# Patient Record
Sex: Female | Born: 1975 | Race: White | Hispanic: No | Marital: Married | Smoking: Never smoker
Health system: Southern US, Community
[De-identification: ages and names within clinical notes are randomized; demographics above are authoritative.]

## PROBLEM LIST (undated history)

## (undated) DIAGNOSIS — C73 Malignant neoplasm of thyroid gland: Secondary | ICD-10-CM

## (undated) DIAGNOSIS — Z923 Personal history of irradiation: Secondary | ICD-10-CM

## (undated) DIAGNOSIS — T4145XA Adverse effect of unspecified anesthetic, initial encounter: Secondary | ICD-10-CM

## (undated) DIAGNOSIS — Z8679 Personal history of other diseases of the circulatory system: Secondary | ICD-10-CM

## (undated) DIAGNOSIS — Z8489 Family history of other specified conditions: Secondary | ICD-10-CM

## (undated) DIAGNOSIS — T8859XA Other complications of anesthesia, initial encounter: Secondary | ICD-10-CM

## (undated) HISTORY — PX: OTHER SURGICAL HISTORY: SHX169

## (undated) HISTORY — PX: INGUINAL HERNIA REPAIR: SUR1180

## (undated) HISTORY — DX: Personal history of irradiation: Z92.3

## (undated) HISTORY — PX: BIOPSY THYROID: PRO38

## (undated) HISTORY — DX: Malignant neoplasm of thyroid gland: C73

---

## 1998-10-19 ENCOUNTER — Other Ambulatory Visit: Admission: RE | Admit: 1998-10-19 | Discharge: 1998-10-19 | Payer: Self-pay | Admitting: Gynecology

## 2001-10-09 ENCOUNTER — Other Ambulatory Visit: Admission: RE | Admit: 2001-10-09 | Discharge: 2001-10-09 | Payer: Self-pay | Admitting: Gynecology

## 2002-12-30 ENCOUNTER — Other Ambulatory Visit: Admission: RE | Admit: 2002-12-30 | Discharge: 2002-12-30 | Payer: Self-pay | Admitting: Gynecology

## 2003-04-22 ENCOUNTER — Emergency Department (HOSPITAL_COMMUNITY): Admission: EM | Admit: 2003-04-22 | Discharge: 2003-04-22 | Payer: Self-pay | Admitting: *Deleted

## 2003-04-22 ENCOUNTER — Encounter: Payer: Self-pay | Admitting: *Deleted

## 2004-06-29 ENCOUNTER — Other Ambulatory Visit: Admission: RE | Admit: 2004-06-29 | Discharge: 2004-06-29 | Payer: Self-pay | Admitting: Gynecology

## 2004-09-28 ENCOUNTER — Ambulatory Visit (HOSPITAL_COMMUNITY): Admission: RE | Admit: 2004-09-28 | Discharge: 2004-09-28 | Payer: Self-pay | Admitting: Obstetrics and Gynecology

## 2004-09-28 IMAGING — US US OB COMP +14 WK
1 series · 13 of 28 positions shown · non-contrast
Comparison: none

CLINICAL DATA: Anatomic exam.  No current problems.

[Series 1: us ob comp +14 wk · 0.33mm/px · 13 of 83 slices shown]
[im 4/83]
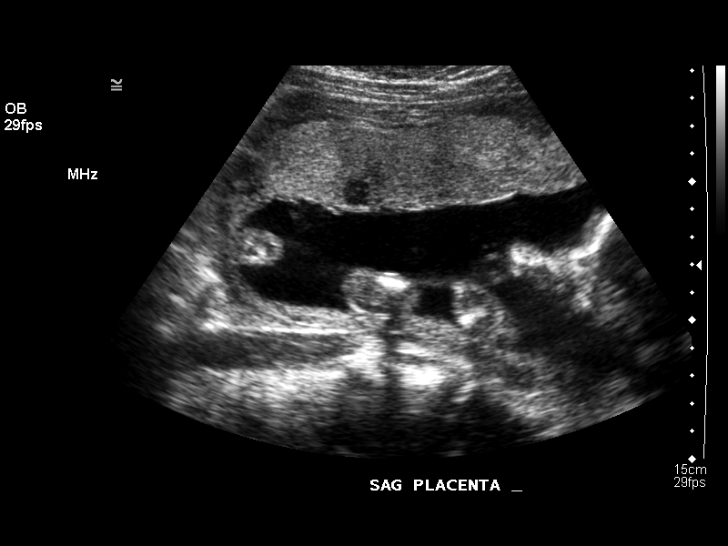
[im 10/83]
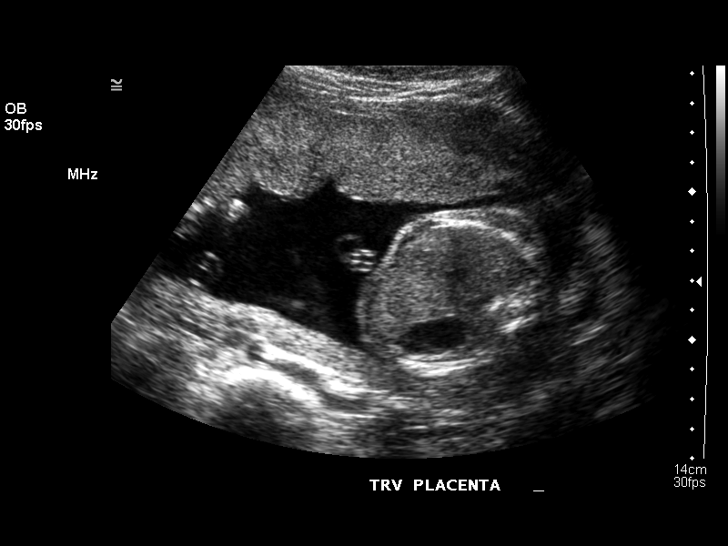
[im 16/83]
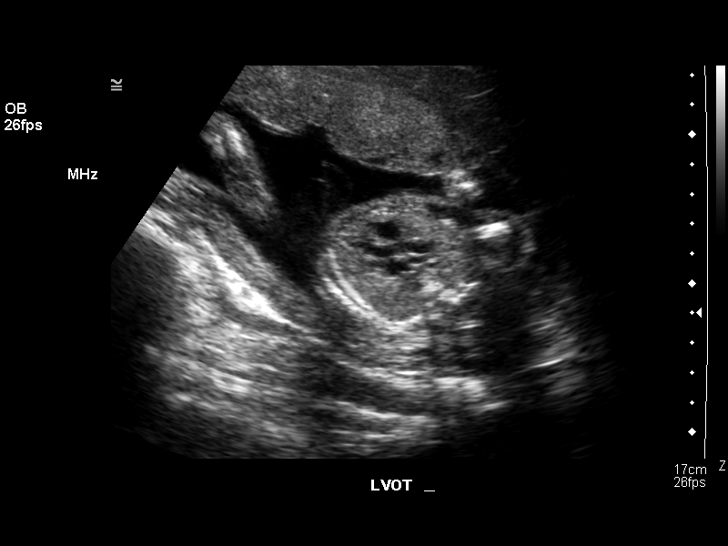
[im 22/83]
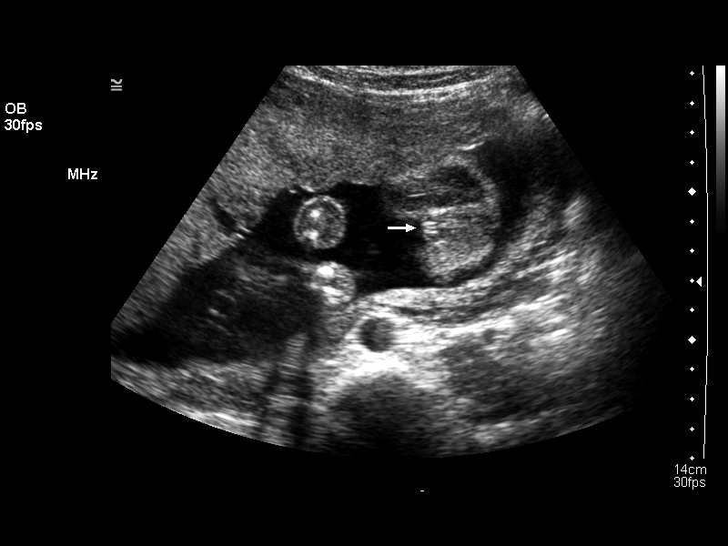
[im 28/83]
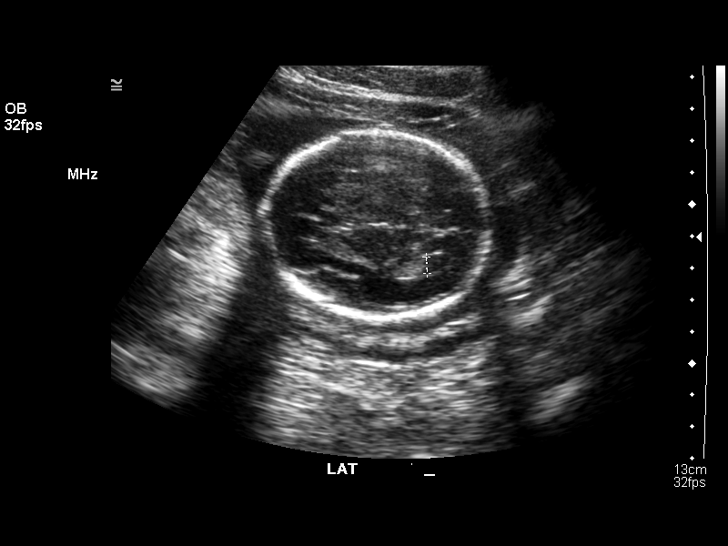
[im 34/83]
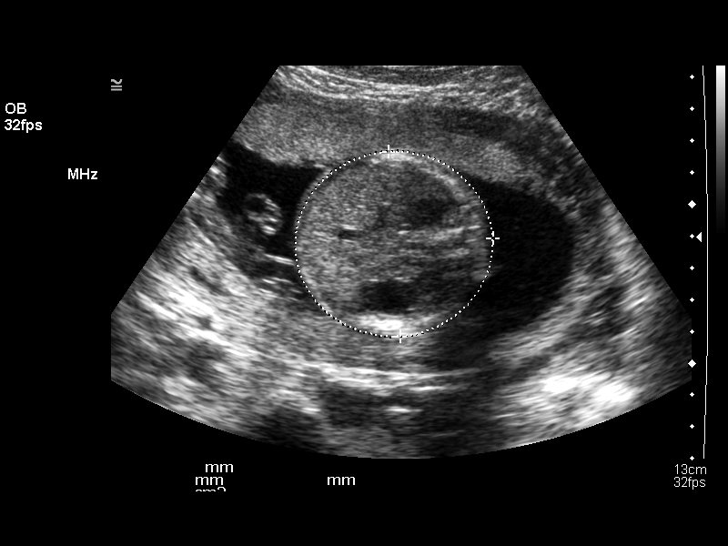
[im 43/83]
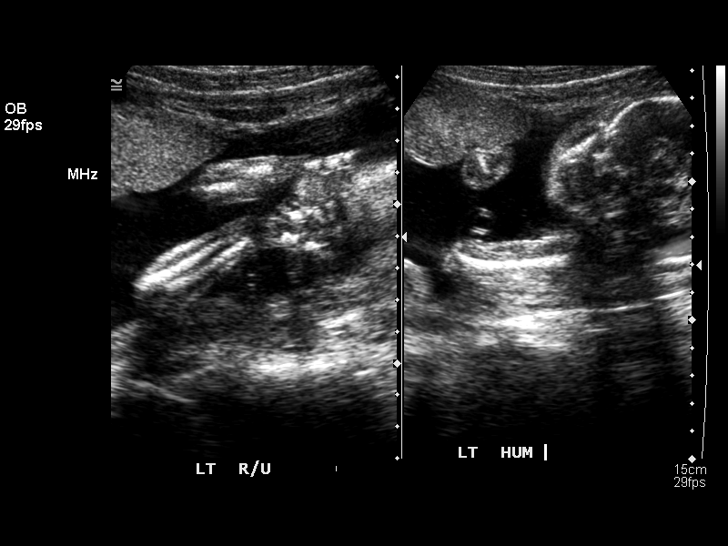
[im 49/83]
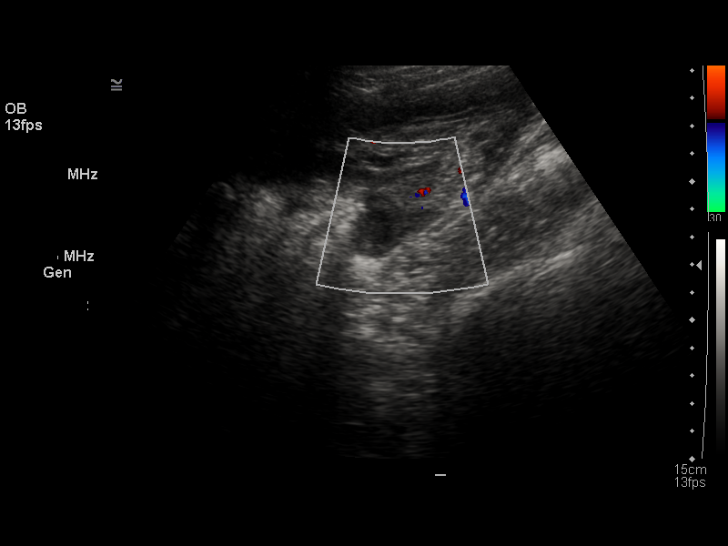
[im 55/83]
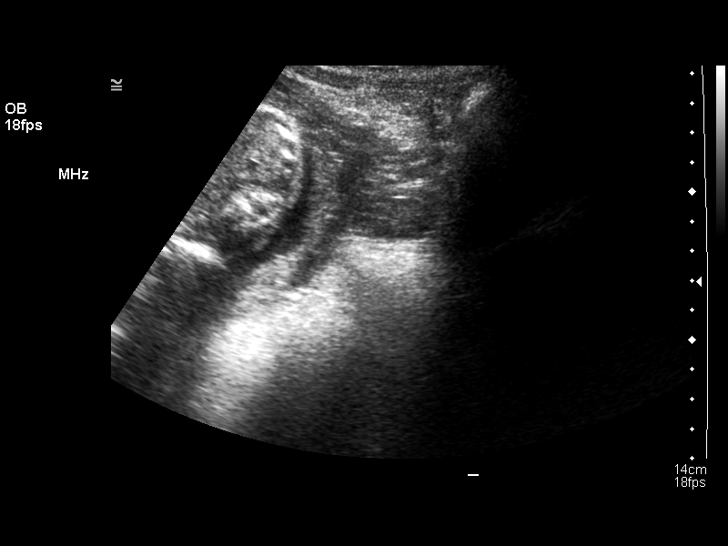
[im 61/83]
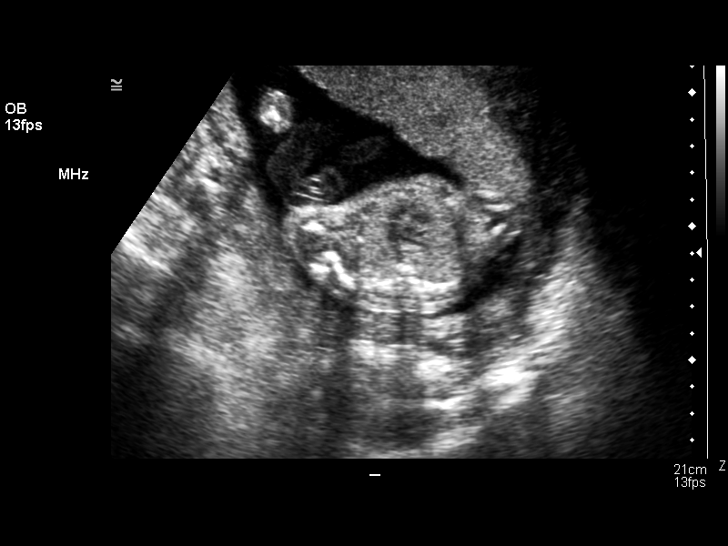
[im 67/83]
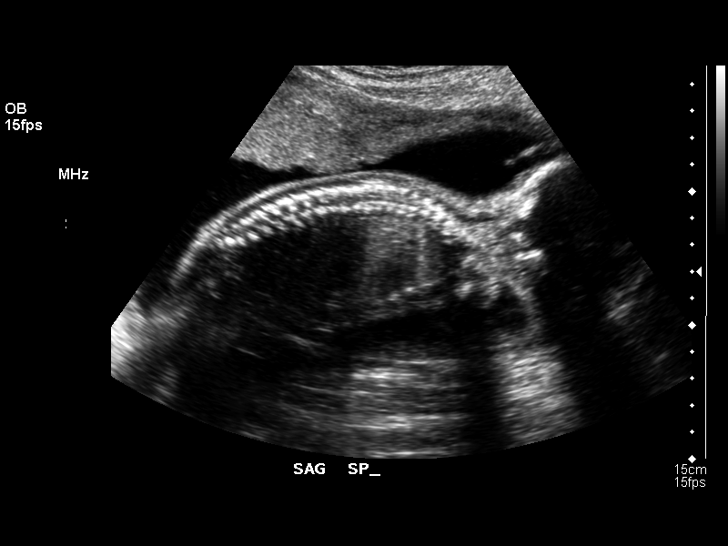
[im 73/83]
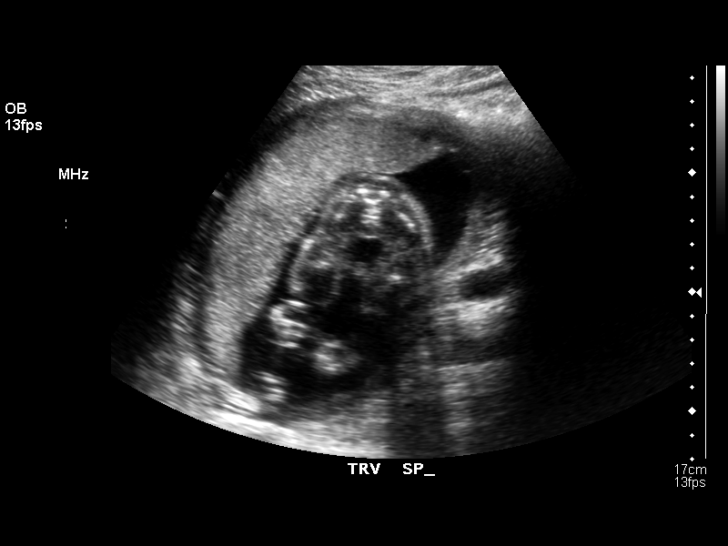
[im 79/83]
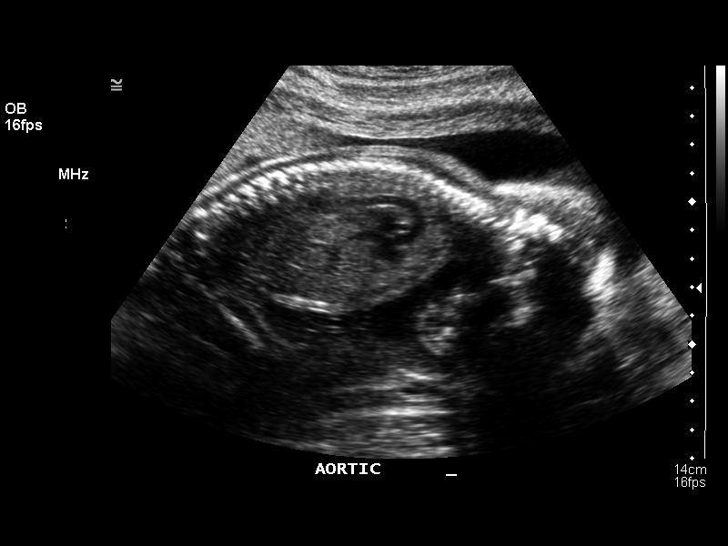

[13 of 28 positions shown; findings below may reference images not displayed]

OBSTETRICAL ULTRASOUND:
Number of Fetuses:  1
Heart Rate:  139
Movement:  Yes
Breathing:    No  
Presentation:  Cephalic
Placental Location:  Anterior
Grade:  I
Previa:  No
Amniotic Fluid (Subjective):  Normal
Amniotic Fluid (Objective):   3.9 cm Vertical pocket 

FETAL BIOMETRY
BPD:  5.8 cm   23 w 6 d
HC:  21.8 cm   23 w 6 d
AC:  19.0 cm   23 w 5 d
FL:    4.2 cm   23 w 4 d

MEAN GA:  23 w 5 d

FETAL ANATOMY
Lateral Ventricles:    Visualized 
Thalami/CSP:      Visualized 
Posterior Fossa:  Visualized 
Nuchal Region:    N/A
Spine:      Visualized 
4 Chamber Heart on Left:      Visualized 
Stomach on Left:      Visualized 
3 Vessel Cord:    Visualized 
Cord Insertion site:    Visualized 
Kidneys:  Visualized 
Bladder:  Visualized 
Extremities:      Visualized 

ADDITIONAL ANATOMY VISUALIZED:  LVOT, RVOT, upper lip, orbits, profile, diaphragm, heel, 5th digit, ductal arch, aortic arch, and female genitalia.  

MATERNAL UTERINE AND ADNEXAL FINDINGS
Cervix:   3.5 cm Transabdominally
IMPRESSION: 1.  Single intrauterine pregnancy demonstrating an estimated gestational age by ultrasound of 23 weeks and 5 days.  Correlation with assigned gestational age by LMP of 23 weeks and 0 days suggests appropriate growth.  
2.  No focal fetal or placental abnormalities are noted with a good anatomic exam possible.  
3.  Subjectively and quantitatively normal amniotic fluid volume and normal cervical length. 

</u12:p>

## 2005-01-28 ENCOUNTER — Inpatient Hospital Stay (HOSPITAL_COMMUNITY): Admission: AD | Admit: 2005-01-28 | Discharge: 2005-01-28 | Payer: Self-pay | Admitting: Gynecology

## 2005-01-29 ENCOUNTER — Inpatient Hospital Stay (HOSPITAL_COMMUNITY): Admission: AD | Admit: 2005-01-29 | Discharge: 2005-01-31 | Payer: Self-pay | Admitting: Obstetrics and Gynecology

## 2005-05-26 ENCOUNTER — Inpatient Hospital Stay (HOSPITAL_COMMUNITY): Admission: AD | Admit: 2005-05-26 | Discharge: 2005-05-26 | Payer: Self-pay | Admitting: Obstetrics and Gynecology

## 2005-09-13 ENCOUNTER — Other Ambulatory Visit: Admission: RE | Admit: 2005-09-13 | Discharge: 2005-09-13 | Payer: Self-pay | Admitting: Obstetrics and Gynecology

## 2006-04-08 ENCOUNTER — Encounter: Admission: RE | Admit: 2006-04-08 | Discharge: 2006-04-08 | Payer: Self-pay | Admitting: Gastroenterology

## 2006-04-08 IMAGING — CR DG BE W/ AIR HIGH DENSITY
8 of 9 series · 8 of 9 positions shown · non-contrast
Comparison: none

CLINICAL DATA: Constipation and abdominal pain. 
 AIR CONTRAST BARIUM ENEMA:
 The colon was adequately visualized in air contrast.  There were several scattered diverticula noted throughout the colon.  There were no persistent intrinsic or extrinsic filling defects and the mucosal pattern of the colon is normal.  The terminal ileum refluxed and had a normal appearance.

[view not recorded (1 of 8)]
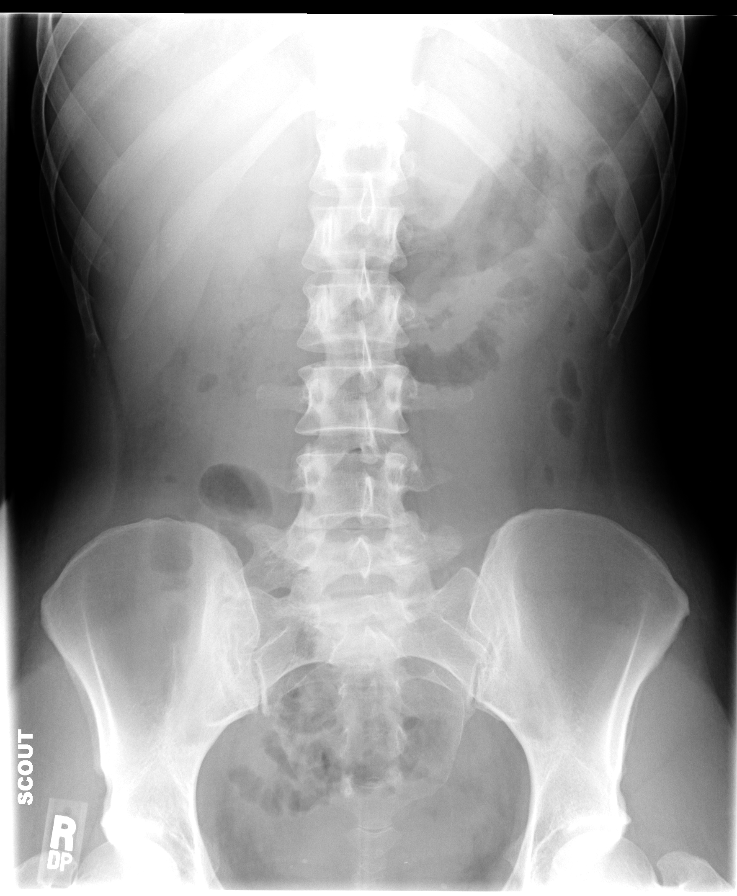

[view not recorded (2 of 8)]
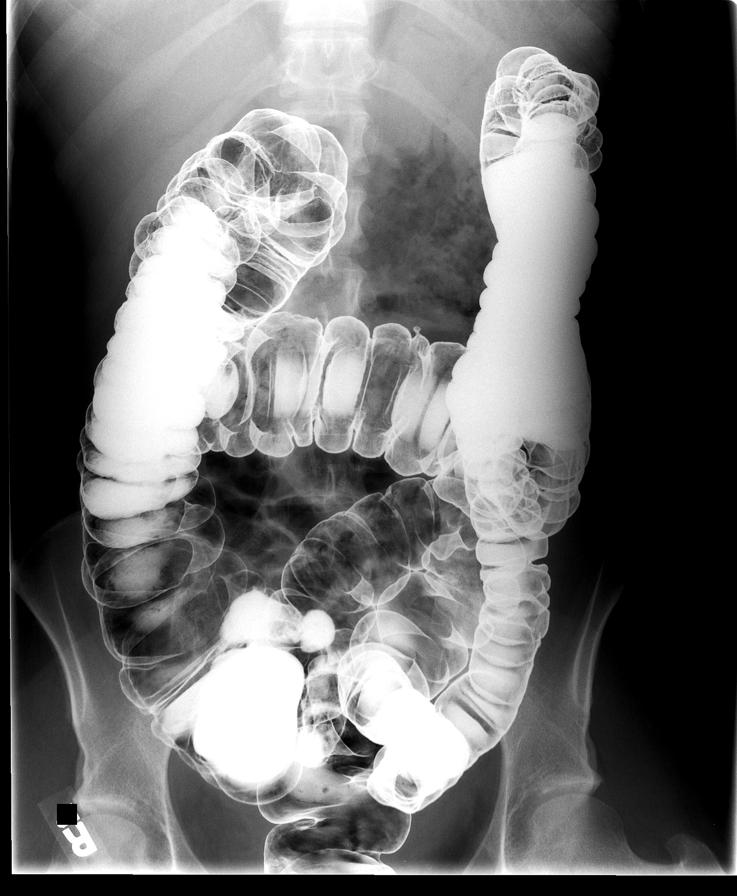

[view not recorded (3 of 8)]
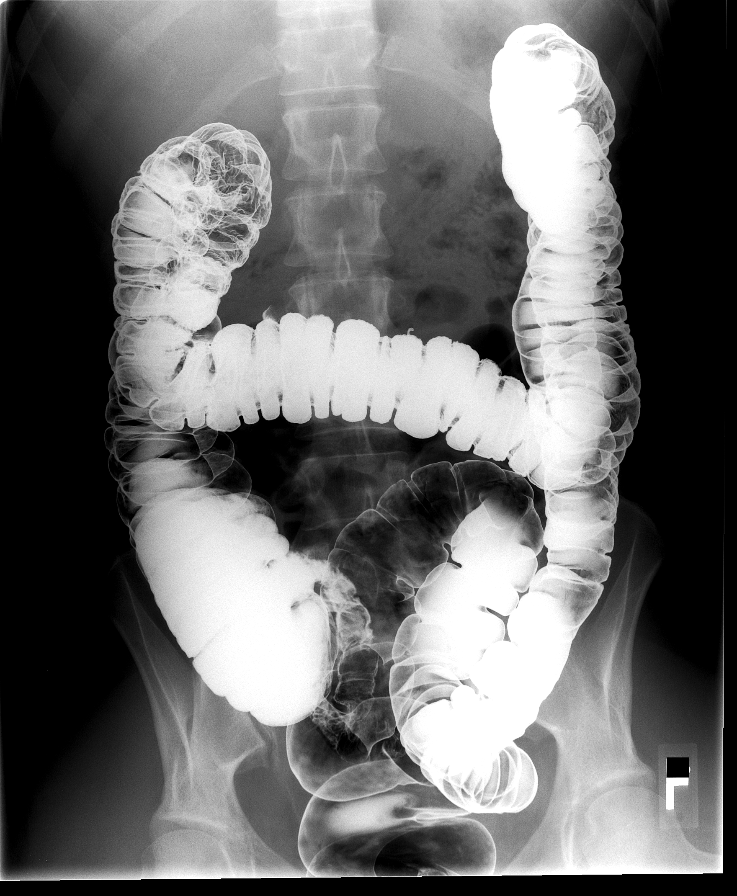

[view not recorded (4 of 8)]
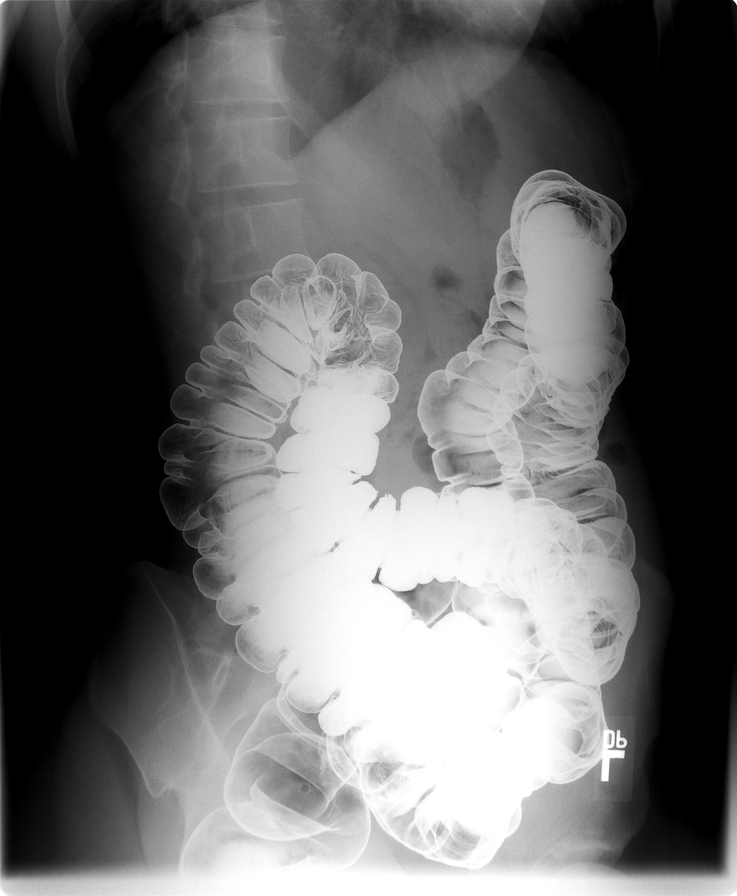

[view not recorded (5 of 8)]
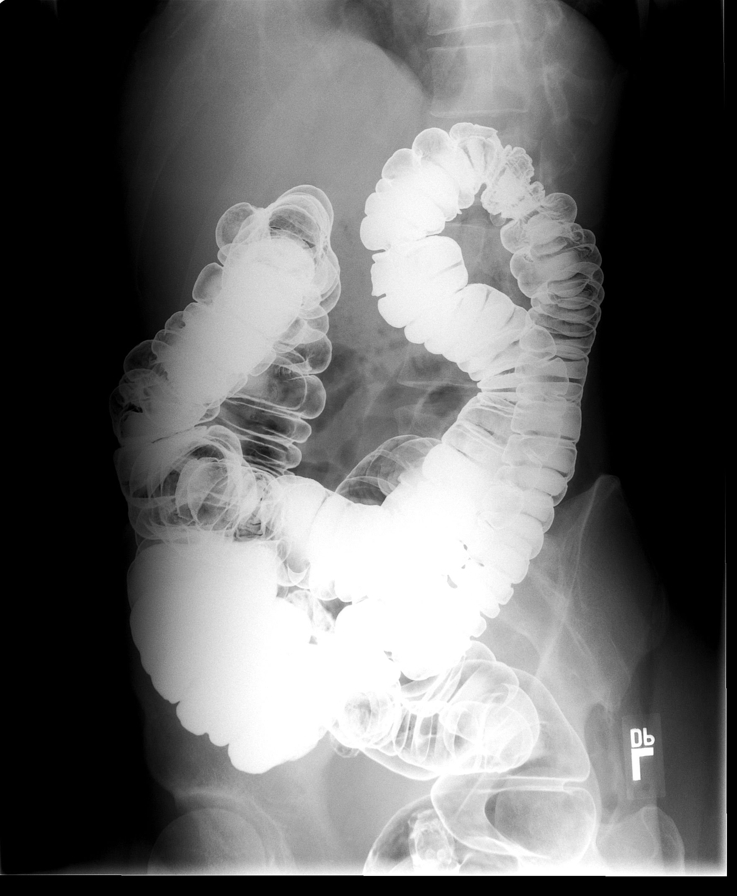

[view not recorded (6 of 8)]
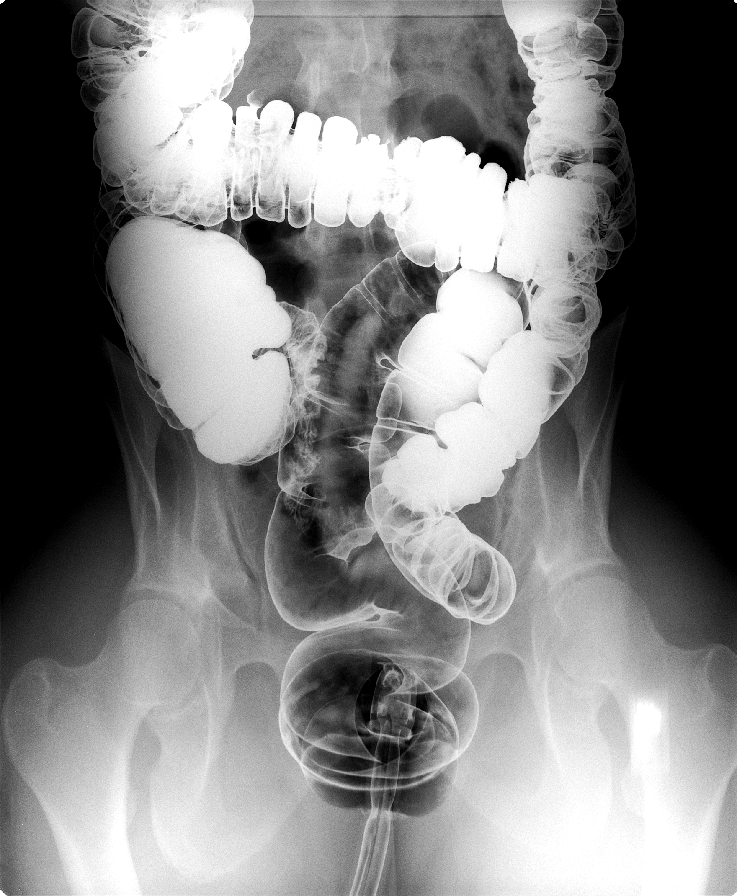

[view not recorded (7 of 8)]
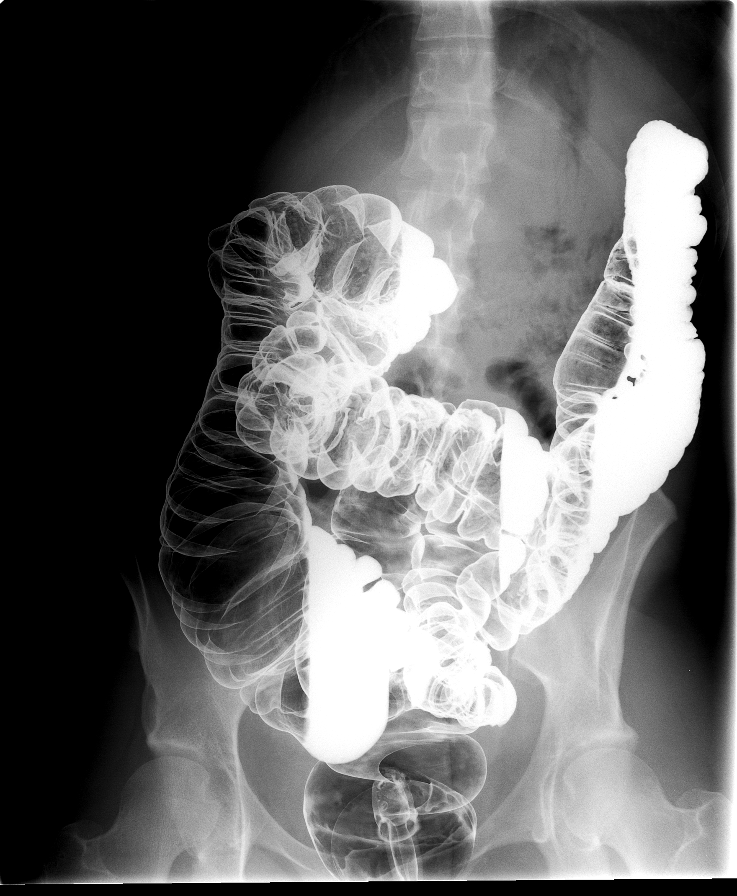

[view not recorded (8 of 8)]
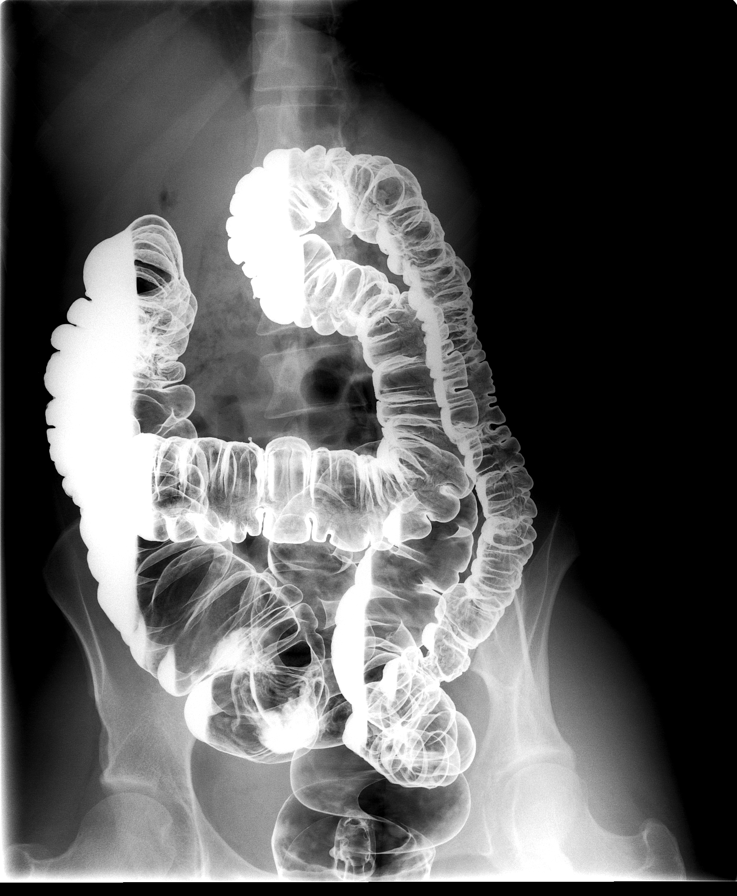

[8 of 9 positions shown; findings below may reference images not displayed]

IMPRESSION: Several scattered colonic diverticula.  Otherwise normal study.

## 2006-04-08 IMAGING — RF DG BE W/ AIR HIGH DENSITY
19 series · 19 of 19 positions shown · non-contrast
Comparison: none

CLINICAL DATA: Constipation and abdominal pain. 
 AIR CONTRAST BARIUM ENEMA:
 The colon was adequately visualized in air contrast.  There were several scattered diverticula noted throughout the colon.  There were no persistent intrinsic or extrinsic filling defects and the mucosal pattern of the colon is normal.  The terminal ileum refluxed and had a normal appearance.

[Series 1: run · 1 of 1 slices shown (1 of 19)]
[im 1/1]
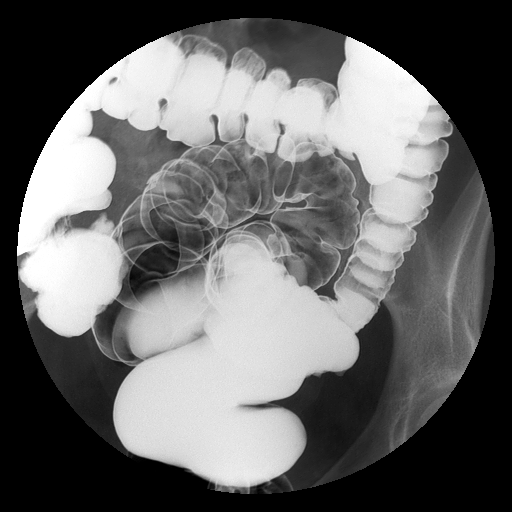

[Series 2: run · 1 of 1 slices shown (2 of 19)]
[im 1/1]
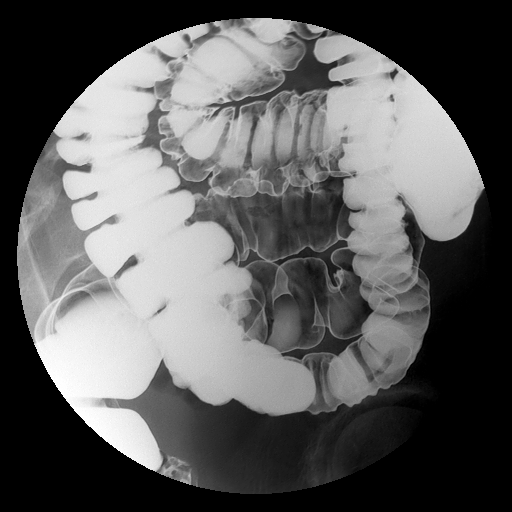

[Series 3: run · 1 of 1 slices shown (3 of 19)]
[im 1/1]
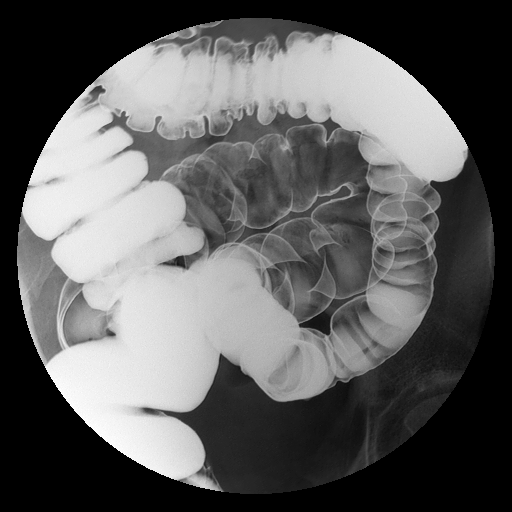

[Series 4: run · 1 of 1 slices shown (4 of 19)]
[im 1/1]
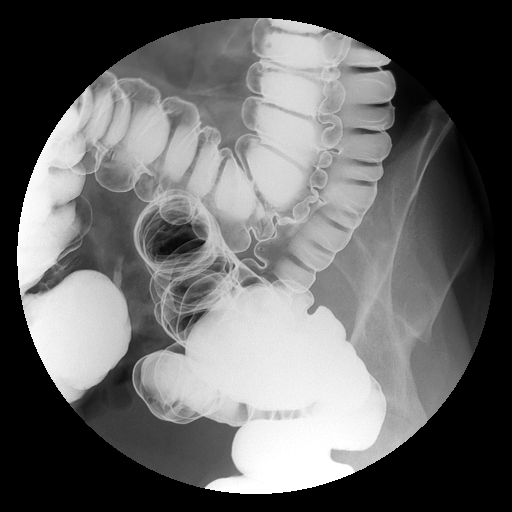

[Series 5: run · 1 of 1 slices shown (5 of 19)]
[im 1/1]
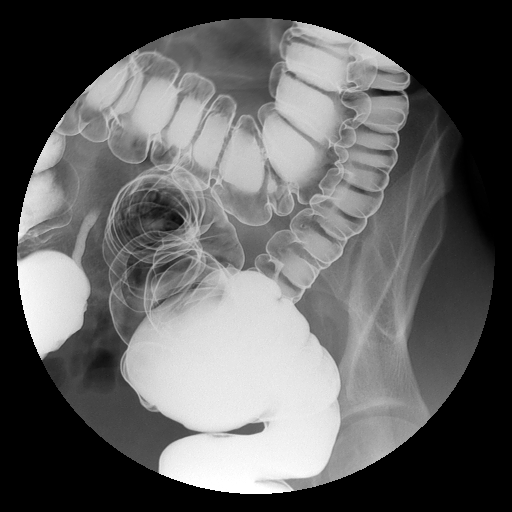

[Series 6: run · 1 of 1 slices shown (6 of 19)]
[im 1/1]
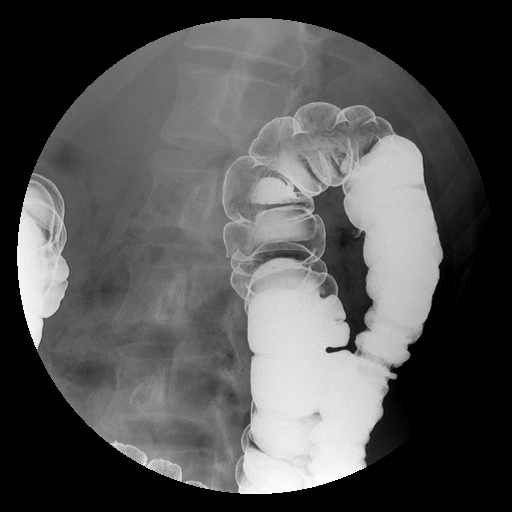

[Series 7: run · 1 of 1 slices shown (7 of 19)]
[im 1/1]
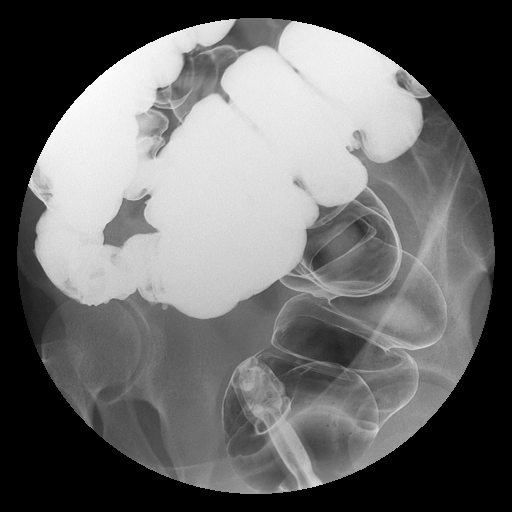

[Series 8: run · 1 of 1 slices shown (8 of 19)]
[im 1/1]
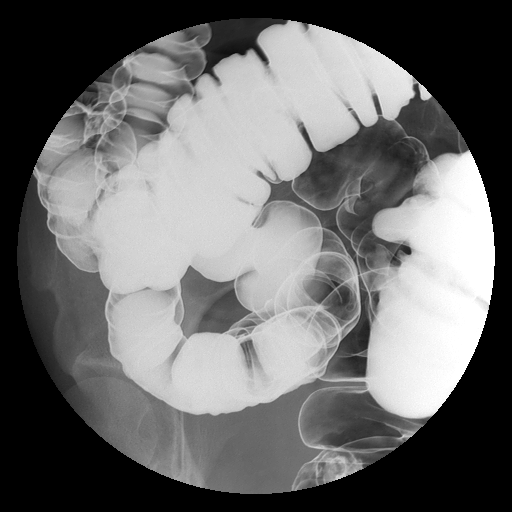

[Series 9: run · 1 of 1 slices shown (9 of 19)]
[im 1/1]
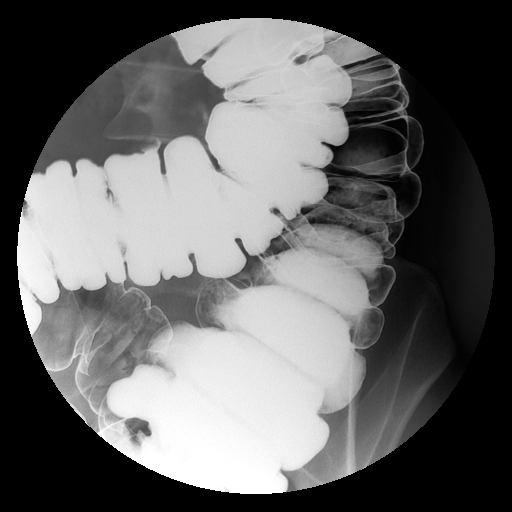

[Series 10: run · 1 of 1 slices shown (10 of 19)]
[im 1/1]
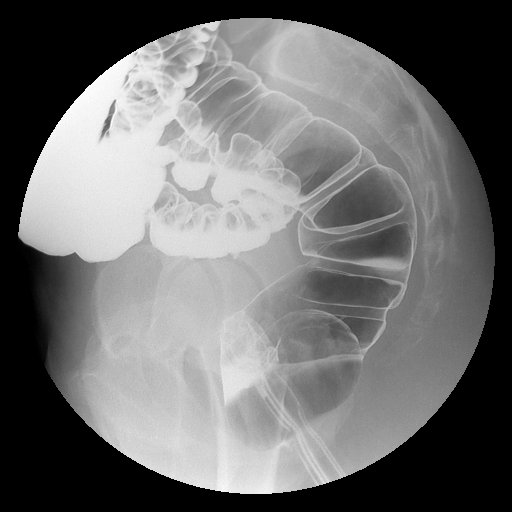

[Series 11: run · 1 of 1 slices shown (11 of 19)]
[im 1/1]
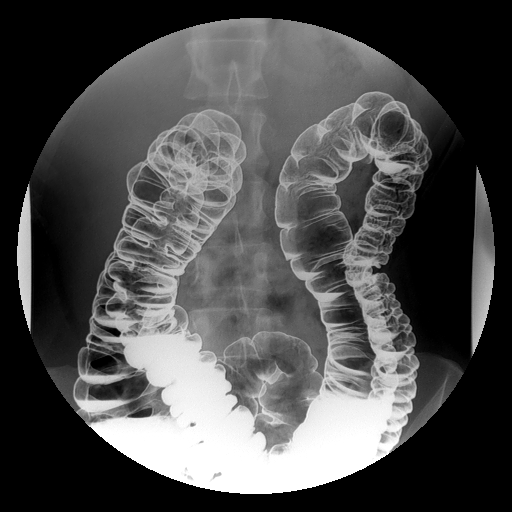

[Series 12: run · 1 of 1 slices shown (12 of 19)]
[im 1/1]
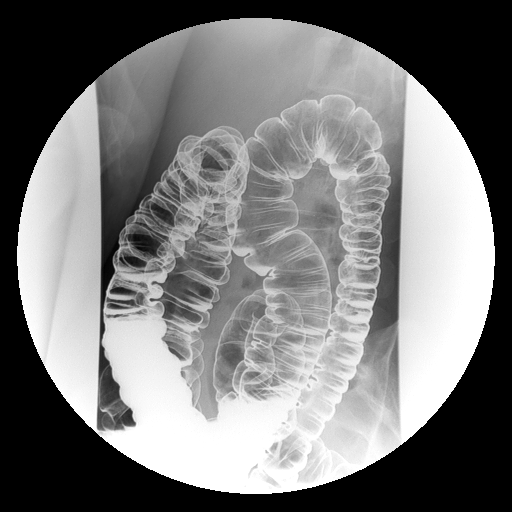

[Series 13: run · 1 of 1 slices shown (13 of 19)]
[im 1/1]
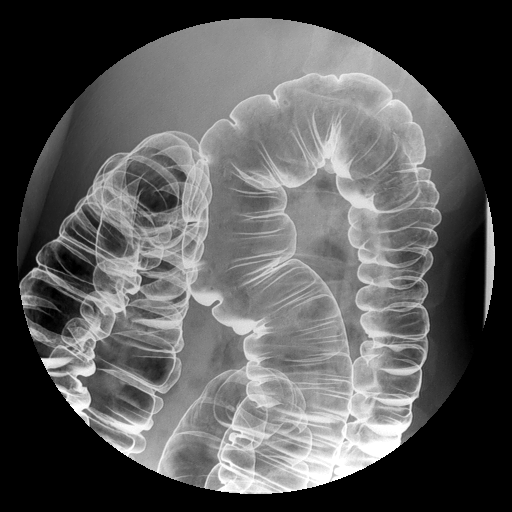

[Series 14: run · 1 of 1 slices shown (14 of 19)]
[im 1/1]
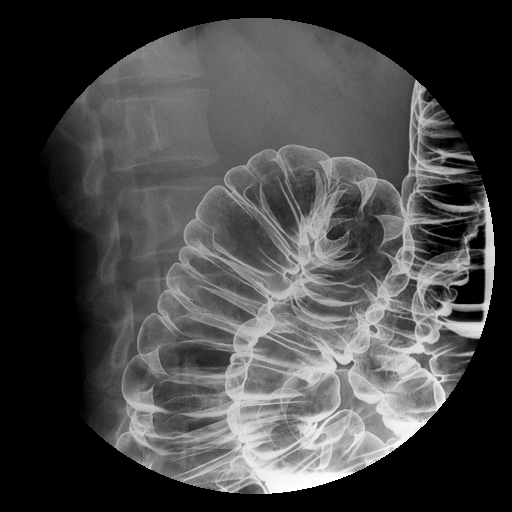

[Series 15: run · 1 of 1 slices shown (15 of 19)]
[im 1/1]
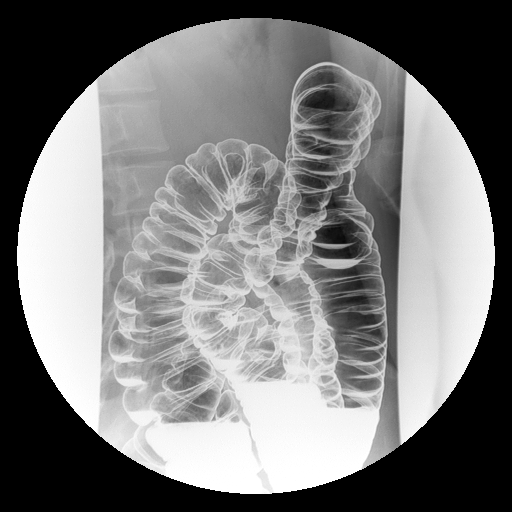

[Series 16: run · 1 of 1 slices shown (16 of 19)]
[im 1/1]
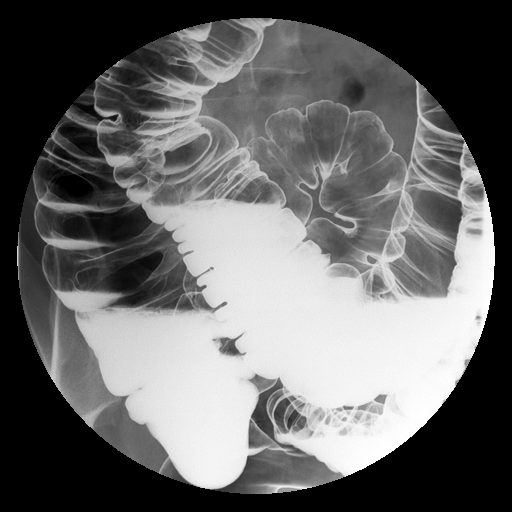

[Series 17: run · 1 of 1 slices shown (17 of 19)]
[im 1/1]
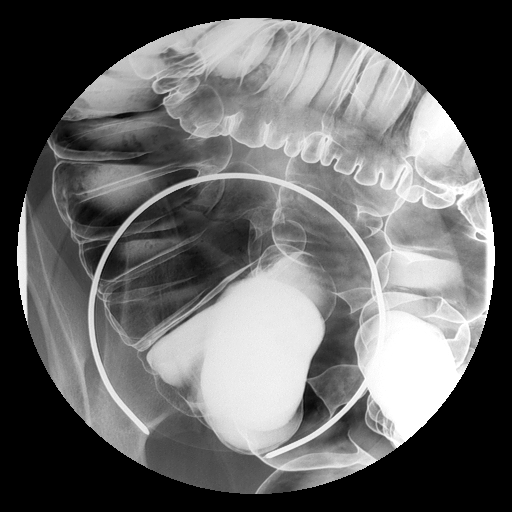

[Series 18: run · 1 of 1 slices shown (18 of 19)]
[im 1/1]
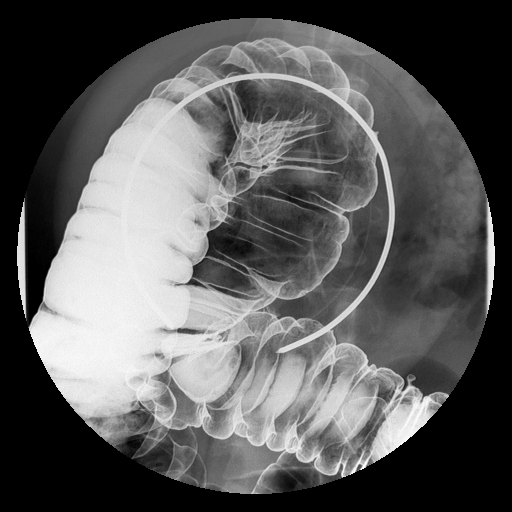

[Series 19: run · 1 of 1 slices shown (19 of 19)]
[im 1/1]
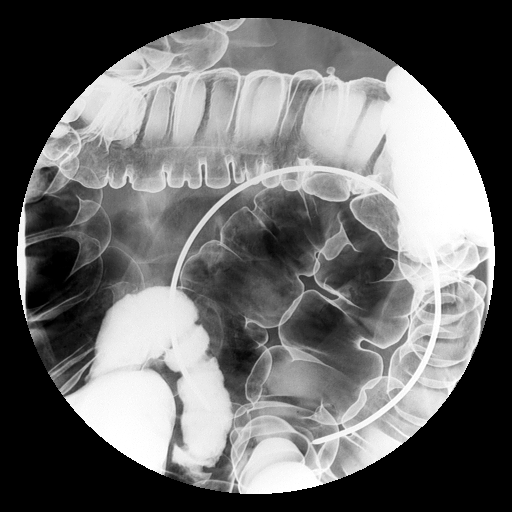

[19 of 19 positions shown; findings below may reference images not displayed]

IMPRESSION: Several scattered colonic diverticula.  Otherwise normal study.

## 2006-11-26 ENCOUNTER — Inpatient Hospital Stay (HOSPITAL_COMMUNITY): Admission: AD | Admit: 2006-11-26 | Discharge: 2006-11-26 | Payer: Self-pay | Admitting: Obstetrics and Gynecology

## 2006-11-26 IMAGING — CR DG CHEST 2V
2 series · 2 of 2 positions shown · non-contrast
Comparison: None

CLINICAL DATA: 30-week pregnant patient with pneumonia; chest congestion.    
 CHEST - 2 VIEW:

[view not recorded (1 of 2)]
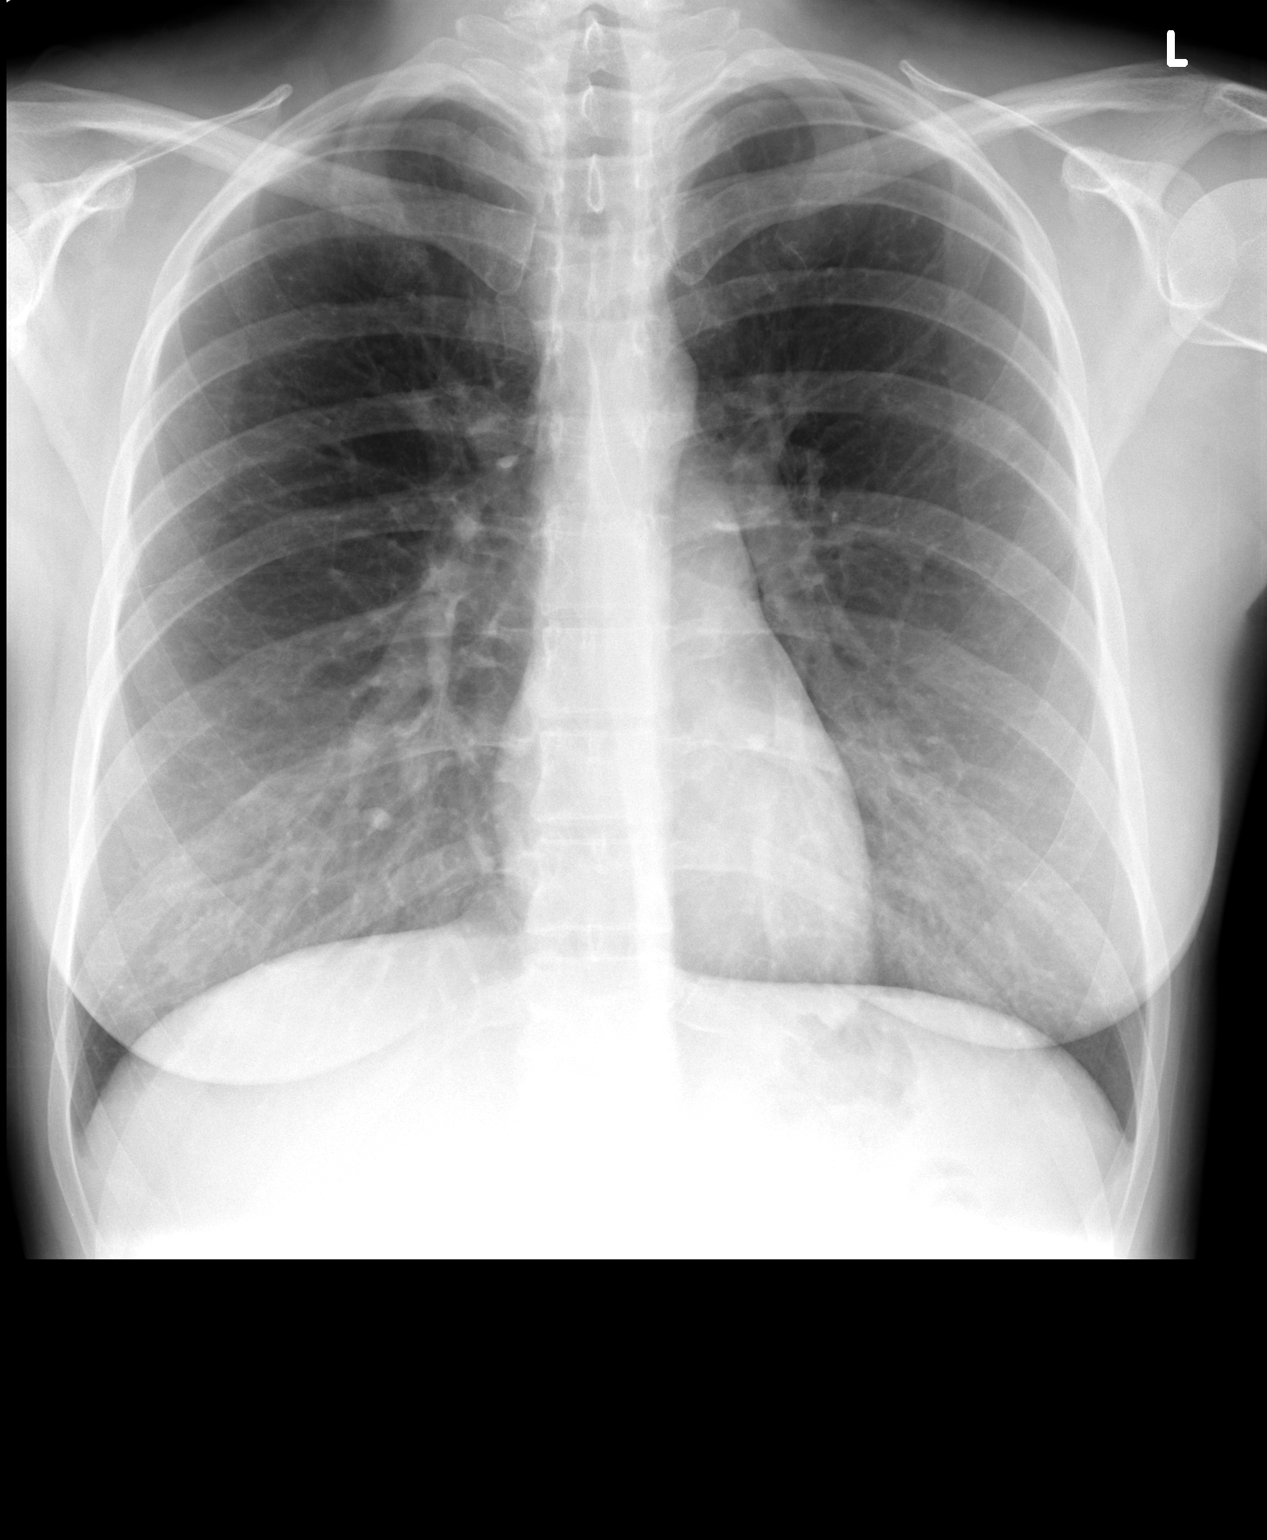

[view not recorded (2 of 2)]
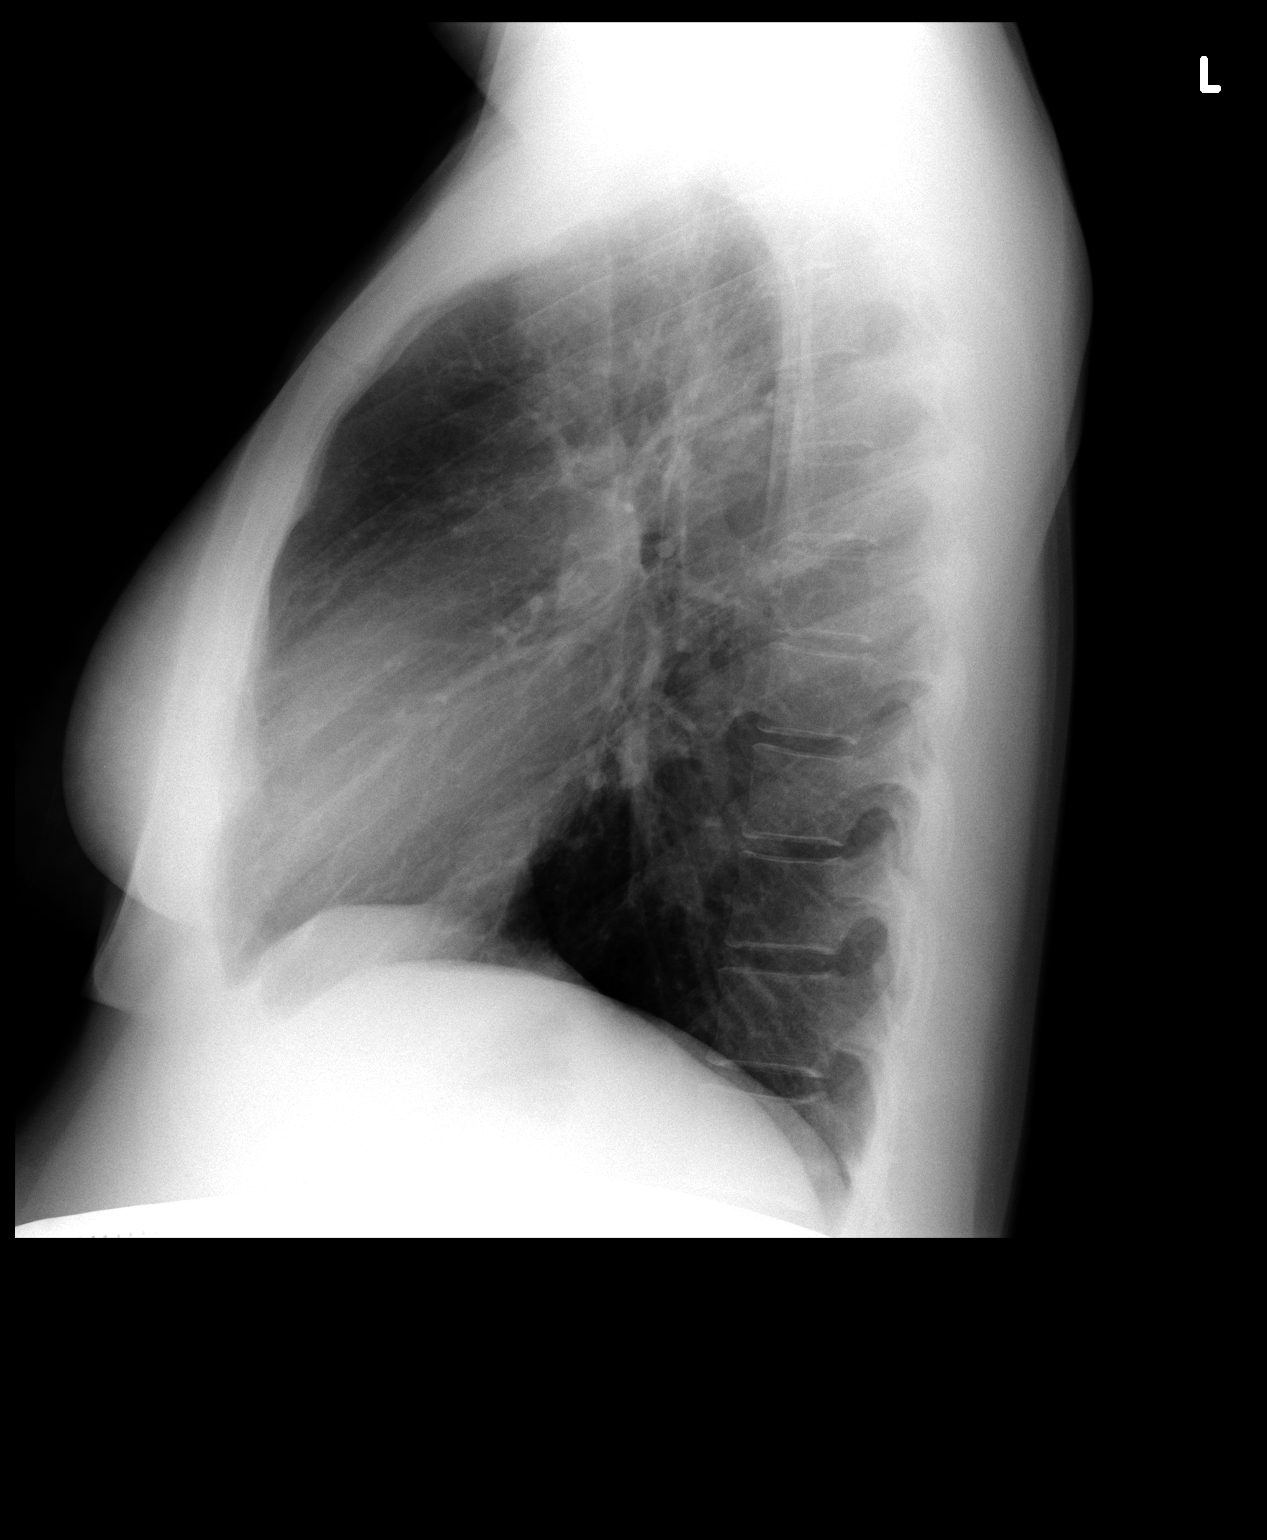

[2 of 2 positions shown; findings below may reference images not displayed]

FINDINGS: The cardiac silhouette, mediastinum, and pulmonary vasculature are within normal limits.   Both lungs are clear of infiltrate and effusion.  The osseous structures are unremarkable.
IMPRESSION: Normal chest x-ray.

## 2007-01-19 ENCOUNTER — Inpatient Hospital Stay (HOSPITAL_COMMUNITY): Admission: AD | Admit: 2007-01-19 | Discharge: 2007-01-20 | Payer: Self-pay | Admitting: Obstetrics and Gynecology

## 2010-12-06 ENCOUNTER — Other Ambulatory Visit: Payer: Self-pay | Admitting: Obstetrics and Gynecology

## 2010-12-06 DIAGNOSIS — N6325 Unspecified lump in the left breast, overlapping quadrants: Secondary | ICD-10-CM

## 2010-12-13 ENCOUNTER — Ambulatory Visit
Admission: RE | Admit: 2010-12-13 | Discharge: 2010-12-13 | Disposition: A | Discharge status: Home / Self Care | Payer: BC Managed Care – PPO | Source: Ambulatory Visit | Attending: Obstetrics and Gynecology | Admitting: Obstetrics and Gynecology

## 2010-12-13 DIAGNOSIS — N6325 Unspecified lump in the left breast, overlapping quadrants: Secondary | ICD-10-CM

## 2010-12-13 IMAGING — MG MM DIGITAL DIAGNOSTIC UNILAT L {BCG}
3 series · 3 of 3 positions shown · non-contrast
Comparison: Baseline mammogram dated [DATE]

CLINICAL DATA: The patient states that she had a palpable left
breast abnormality that has almost completely resolved

DIGITAL DIAGNOSTIC LEFT MAMMOGRAM WITH CAD AND LEFT BREAST
ULTRASOUND:

[L CC]
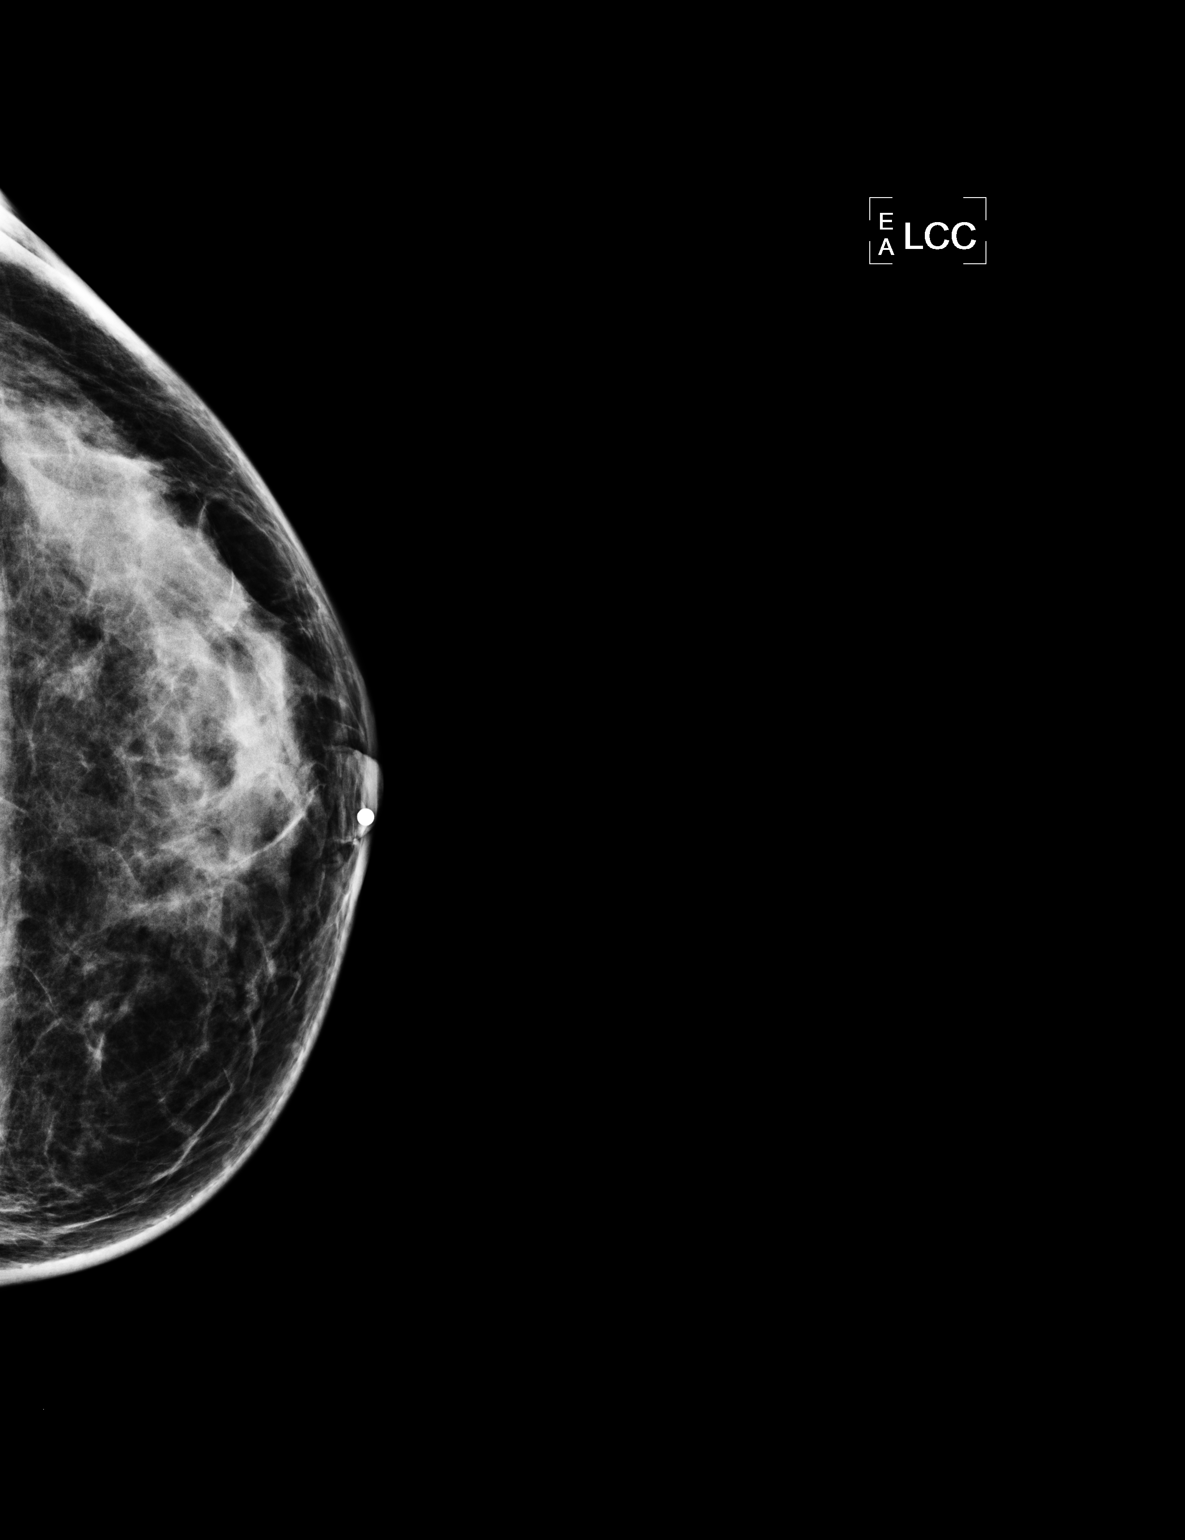

[L MLO]
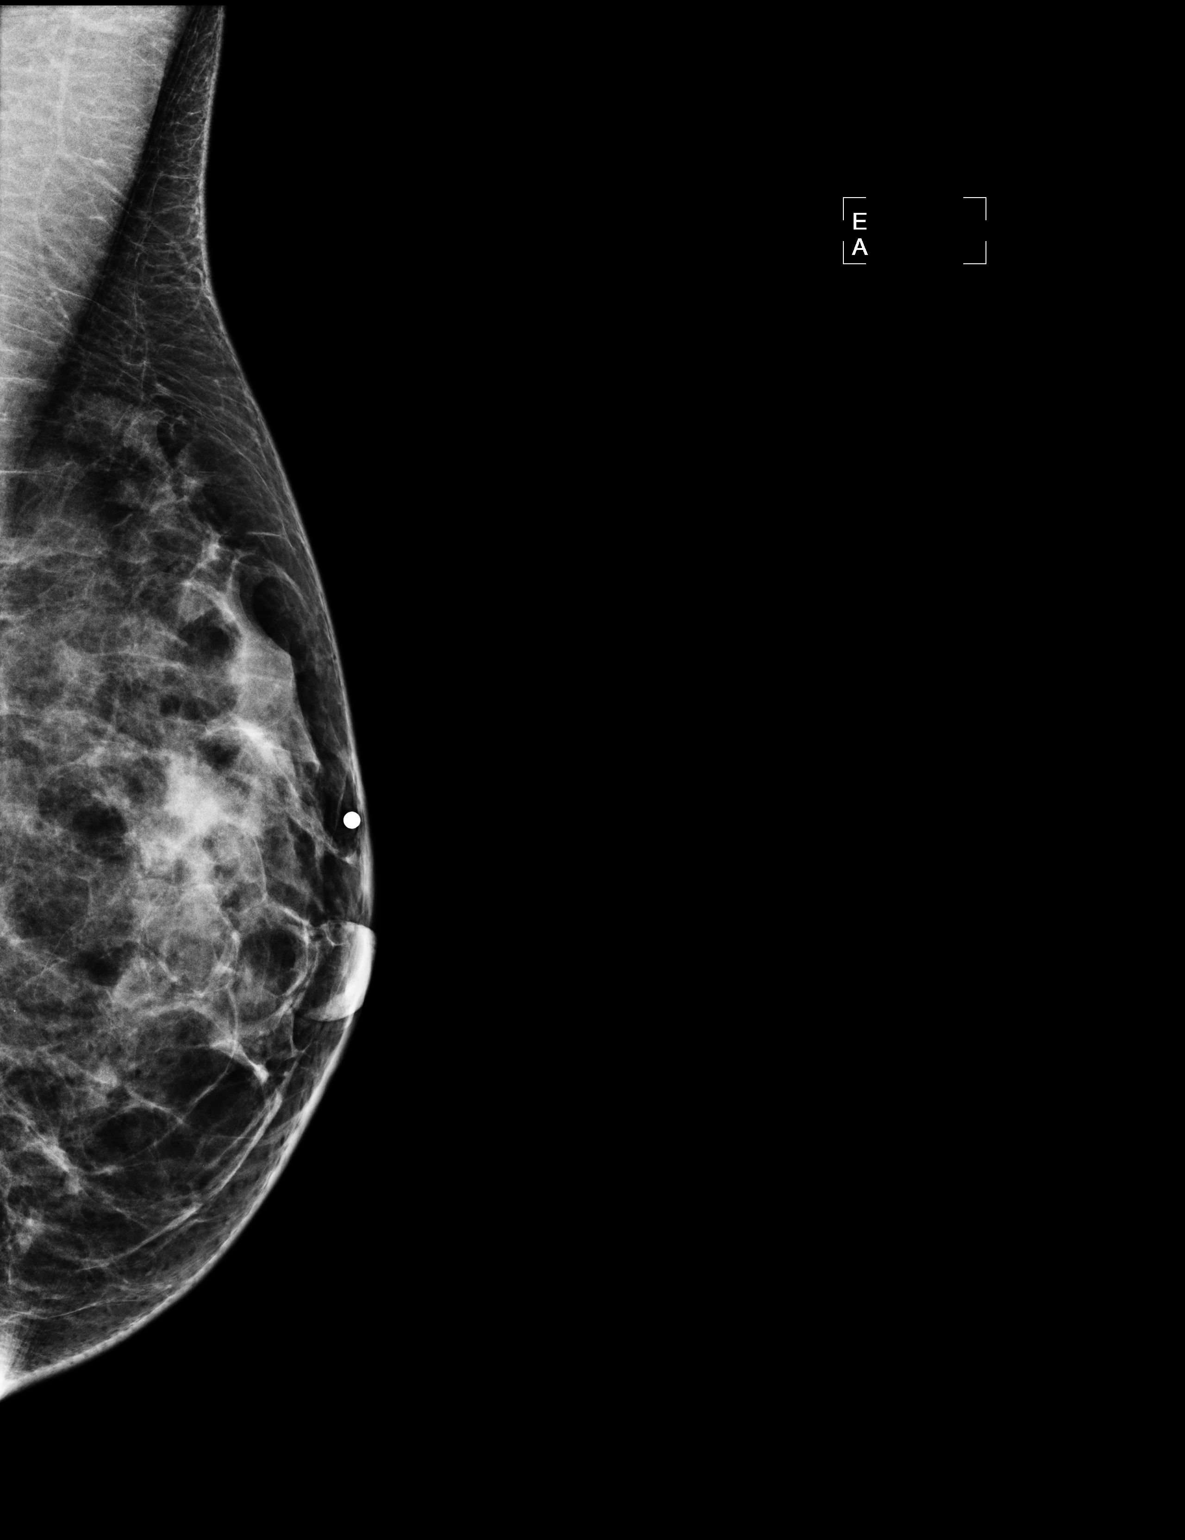

[L TAN]
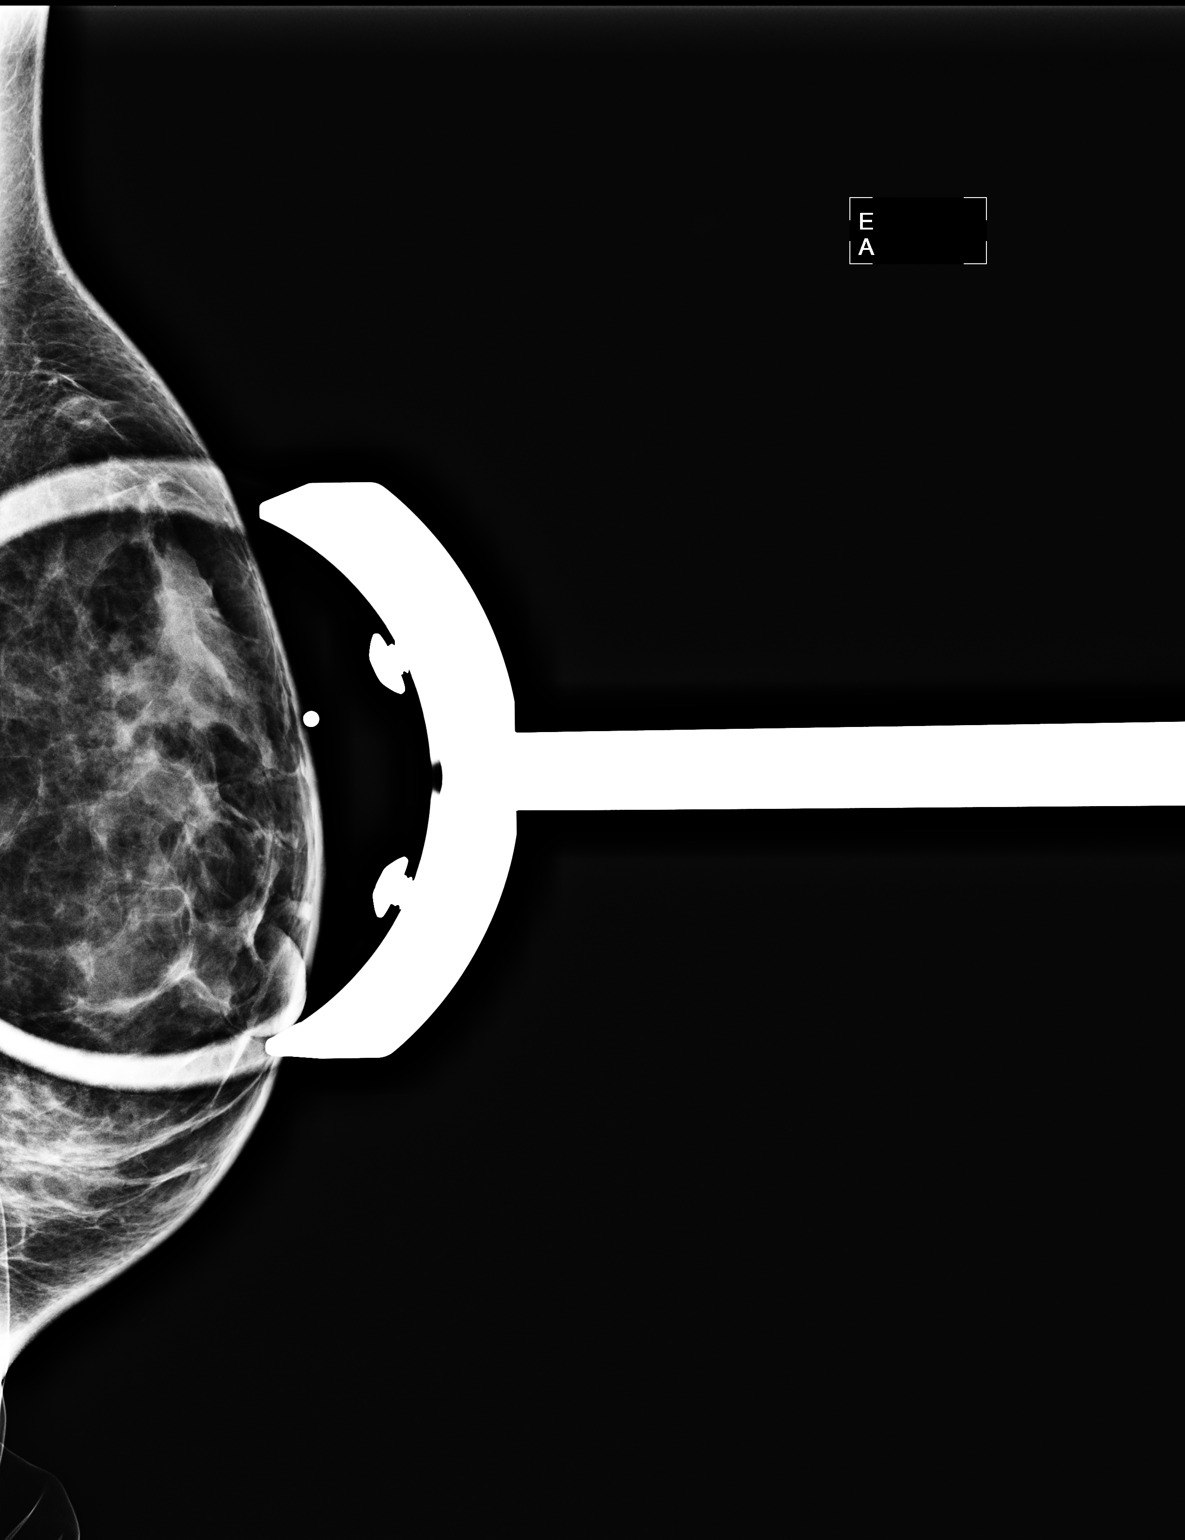

[3 of 3 positions shown; findings below may reference images not displayed]

FINDINGS: There is a dense fibroglandular pattern.  There is no
suspicious mass or malignant-type microcalcifications.
Mammographic images were processed with CAD.

On physical exam, I do not palpate a discrete mass in the left
breast.

Ultrasound is performed, showing normal tissue in the patient's
area of clinical concern in the 12 o'clock region of the left
breast.
IMPRESSION: No evidence of malignancy in the left breast.  Screening mammogram
can be deferred until the age 39 if the clinical exam remains
benign/stable.

BI-RADS CATEGORY 1:  Negative.

## 2013-03-15 DIAGNOSIS — M1711 Unilateral primary osteoarthritis, right knee: Secondary | ICD-10-CM | POA: Insufficient documentation

## 2013-03-15 DIAGNOSIS — M25861 Other specified joint disorders, right knee: Secondary | ICD-10-CM | POA: Insufficient documentation

## 2013-11-13 ENCOUNTER — Other Ambulatory Visit: Payer: Self-pay | Admitting: Obstetrics and Gynecology

## 2014-10-18 ENCOUNTER — Other Ambulatory Visit: Payer: Self-pay | Admitting: Endocrinology

## 2014-10-18 DIAGNOSIS — E042 Nontoxic multinodular goiter: Secondary | ICD-10-CM

## 2014-11-11 ENCOUNTER — Ambulatory Visit (HOSPITAL_COMMUNITY): Payer: Self-pay

## 2014-11-19 ENCOUNTER — Ambulatory Visit (HOSPITAL_COMMUNITY)
Admission: RE | Admit: 2014-11-19 | Discharge: 2014-11-19 | Disposition: A | Discharge status: Home / Self Care | Payer: BLUE CROSS/BLUE SHIELD | Source: Ambulatory Visit | Attending: Endocrinology | Admitting: Endocrinology

## 2014-11-19 ENCOUNTER — Encounter (HOSPITAL_COMMUNITY)
Admission: RE | Admit: 2014-11-19 | Discharge: 2014-11-19 | Disposition: A | Discharge status: Home / Self Care | Payer: BLUE CROSS/BLUE SHIELD | Source: Ambulatory Visit | Attending: Endocrinology | Admitting: Endocrinology

## 2014-11-19 DIAGNOSIS — E041 Nontoxic single thyroid nodule: Secondary | ICD-10-CM | POA: Diagnosis present

## 2014-11-19 DIAGNOSIS — E042 Nontoxic multinodular goiter: Secondary | ICD-10-CM

## 2014-11-19 IMAGING — US US SOFT TISSUE HEAD/NECK
1 series · 14 of 25 positions shown · non-contrast
Comparison: None.

CLINICAL DATA: Thyroid nodule diagnosed elsewhere

EXAM:
THYROID ULTRASOUND
TECHNIQUE: Ultrasound examination of the thyroid gland and adjacent soft
tissues was performed.

[Series 1: us soft tissue head/neck · 0.07mm/px · 14 of 54 slices shown]
[im 1/54]
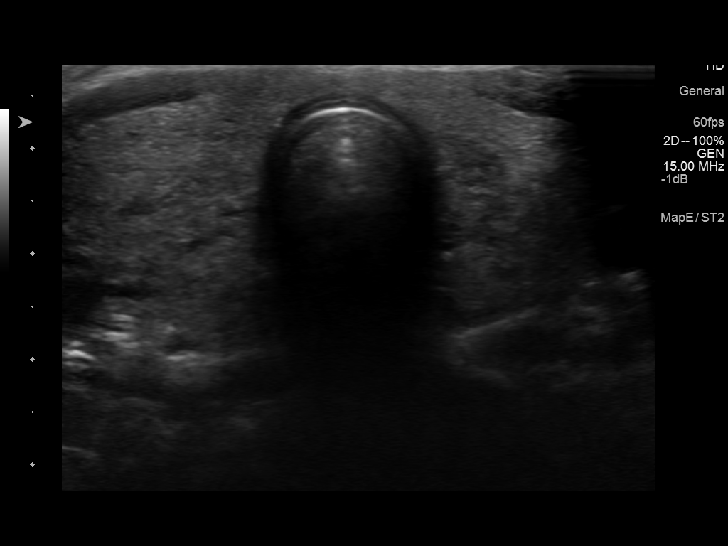
[im 5/54]
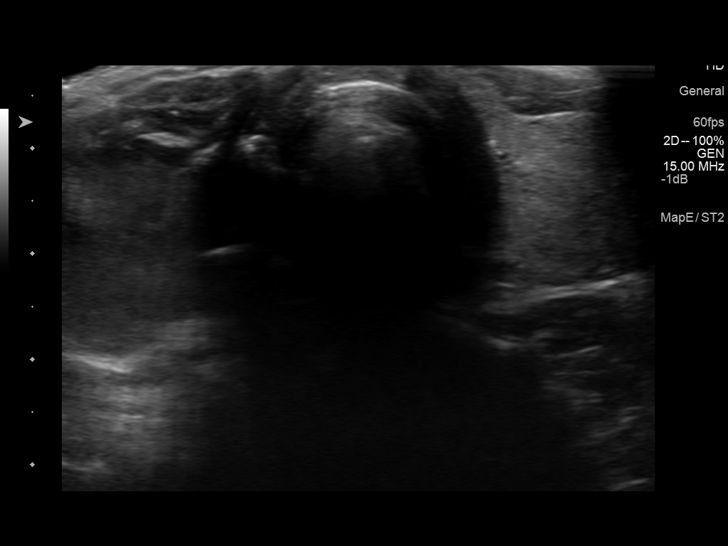
[im 9/54]
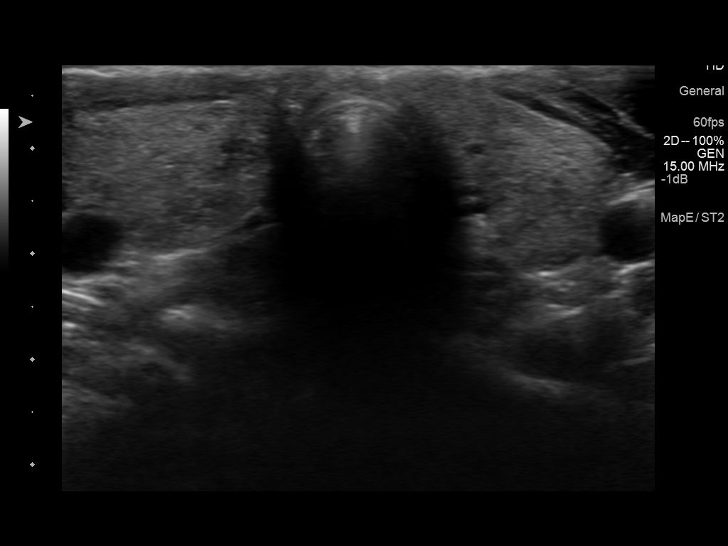
[im 14/54]
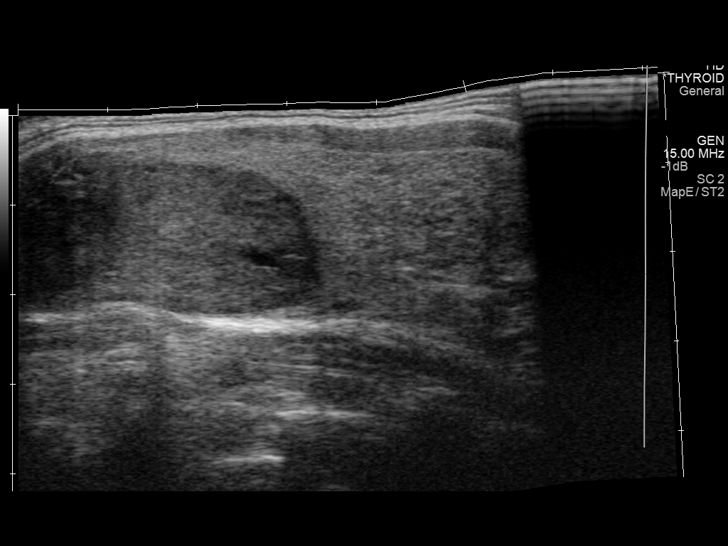
[im 18/54]
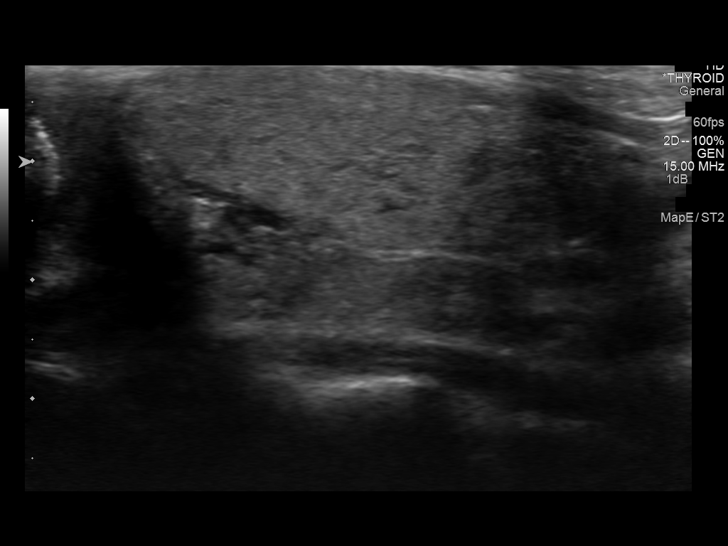
[im 20/54]
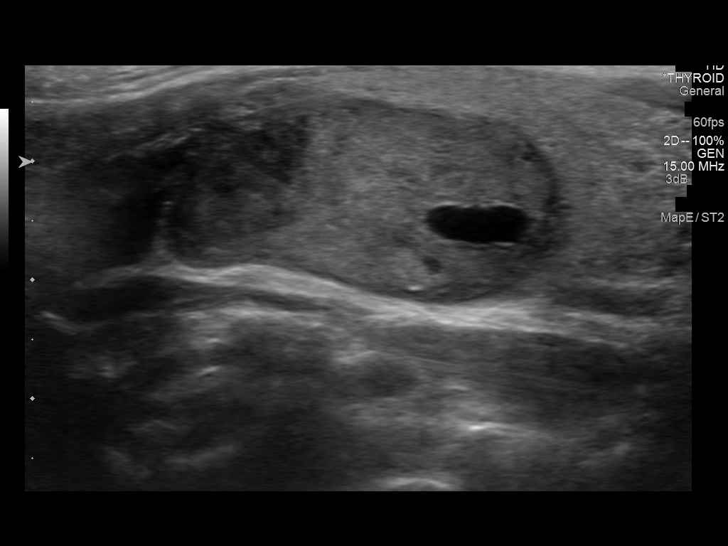
[im 25/54]
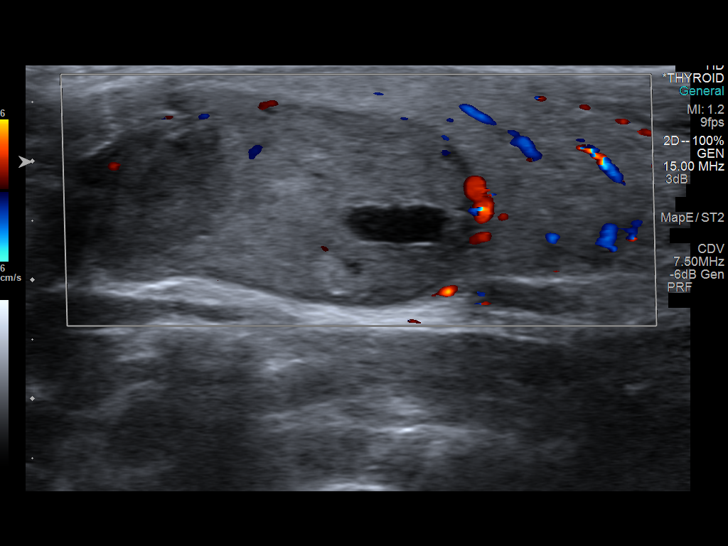
[im 29/54]
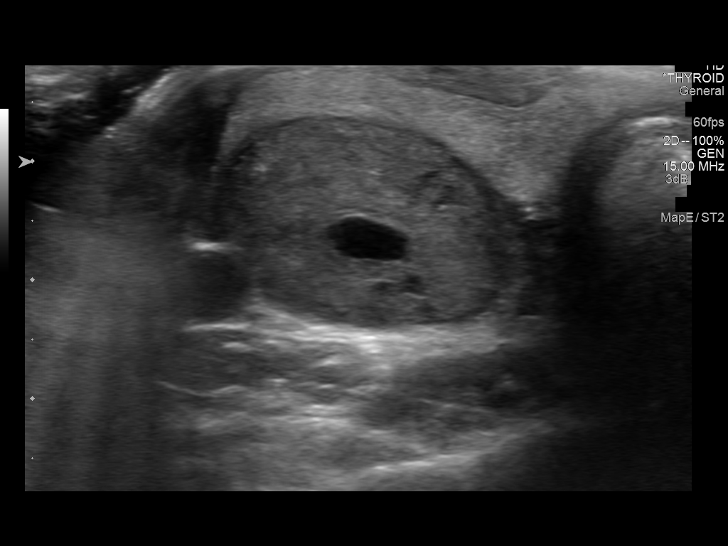
[im 34/54]
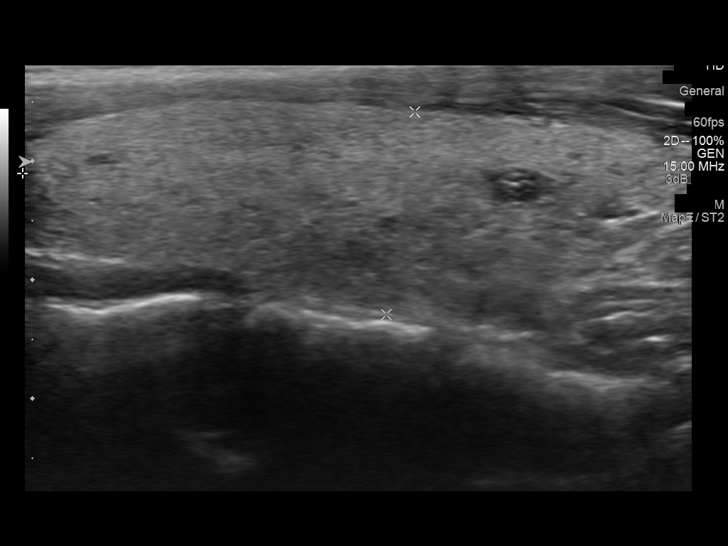
[im 36/54]
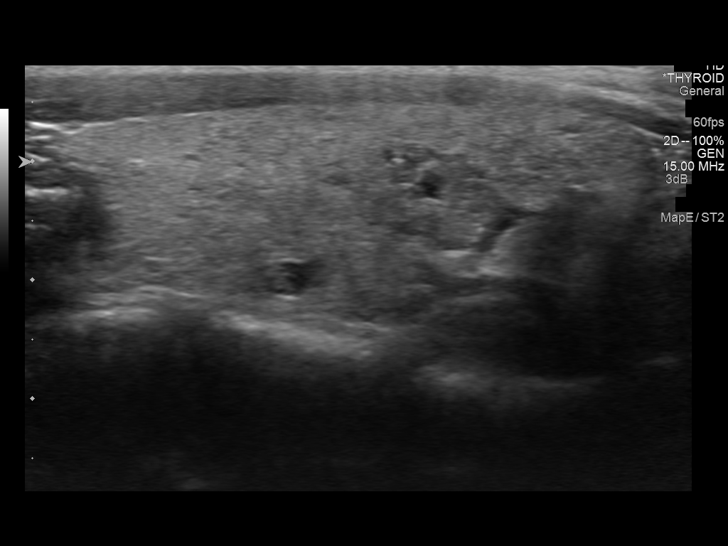
[im 40/54]
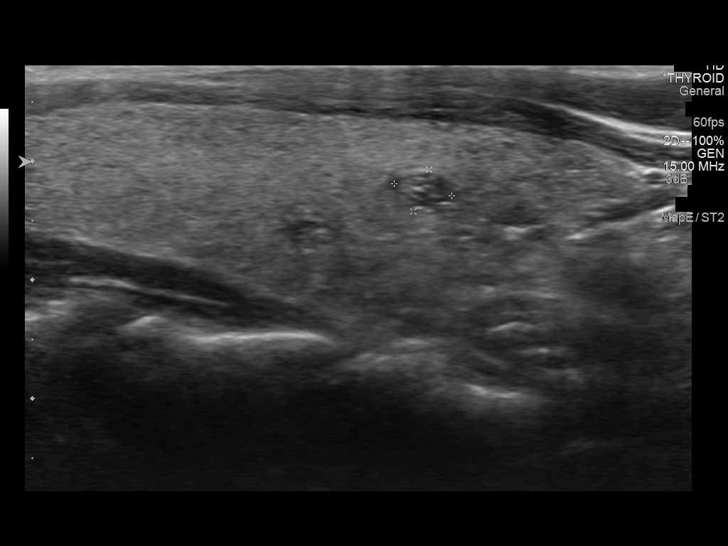
[im 45/54]
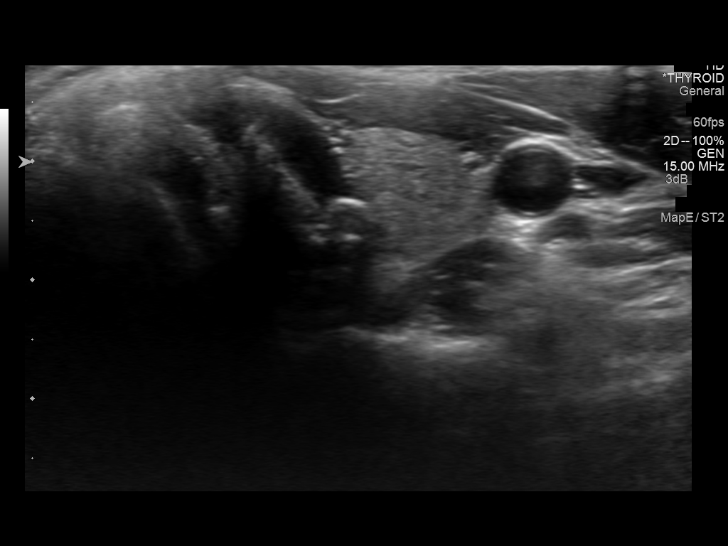
[im 49/54]
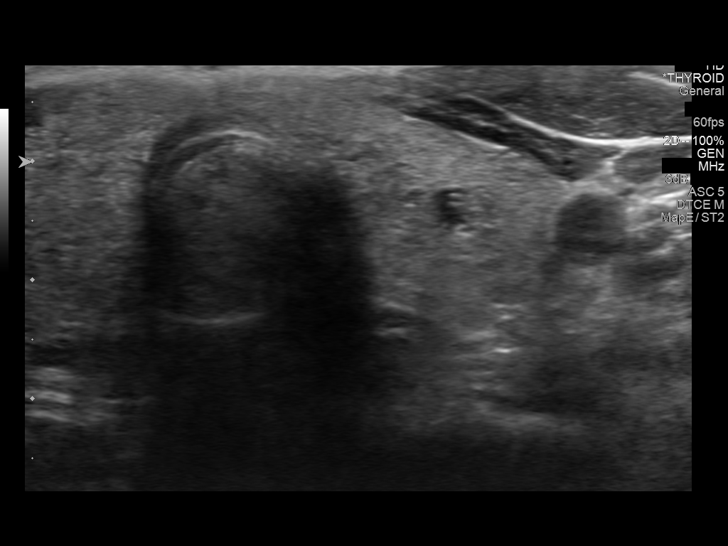
[im 54/54]
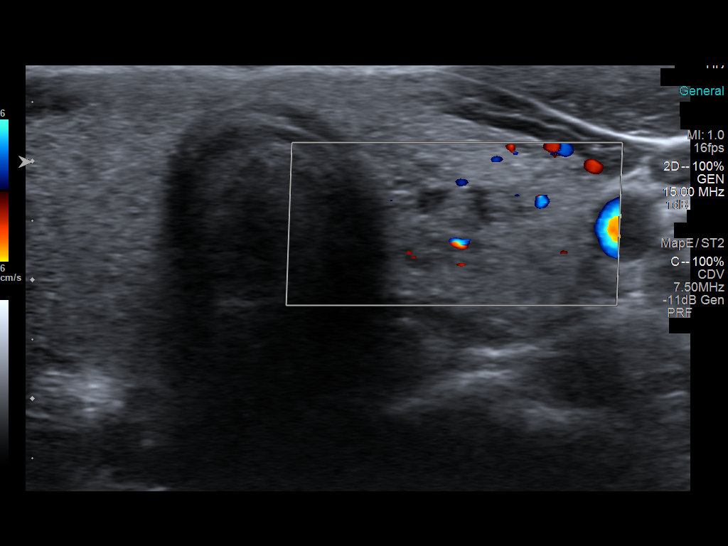

[14 of 25 positions shown; findings below may reference images not displayed]

FINDINGS: Right thyroid lobe

Measurements: 56 x 19 x 30 mm. Solitary 31 x 16 x 25 mm complex
mostly solid nodule, mid lobe.

Left thyroid lobe

Measurements: 57 x 17 x 19 mm. 5 x 4 x 6 mm nodule, inferior pole. 3
mm cyst, deep mid lobe.

Isthmus

Thickness: 2 mm.  No nodules visualized.

Lymphadenopathy

None visualized.
IMPRESSION: 1. Thyromegaly with a dominant 3.1 cm complex right nodule. Findings
meet consensus criteria for biopsy. Ultrasound-guided fine needle
aspiration should be considered, as per the consensus statement:
Management of Thyroid Nodules Detected at US: Society of
Radiologists in Ultrasound Consensus Conference Statement. Radiology

## 2014-11-19 MED ORDER — SODIUM PERTECHNETATE TC 99M INJECTION
10.0000 | Freq: Once | INTRAVENOUS | Status: AC | PRN
  Administered 2014-11-19: 10 via INTRAVENOUS

## 2014-11-29 ENCOUNTER — Other Ambulatory Visit: Payer: Self-pay | Admitting: Endocrinology

## 2014-11-29 DIAGNOSIS — E041 Nontoxic single thyroid nodule: Secondary | ICD-10-CM

## 2014-12-15 ENCOUNTER — Ambulatory Visit
Admission: RE | Admit: 2014-12-15 | Discharge: 2014-12-15 | Disposition: A | Discharge status: Home / Self Care | Payer: BLUE CROSS/BLUE SHIELD | Source: Ambulatory Visit | Attending: Endocrinology | Admitting: Endocrinology

## 2014-12-15 ENCOUNTER — Other Ambulatory Visit (HOSPITAL_COMMUNITY)
Admission: RE | Admit: 2014-12-15 | Discharge: 2014-12-15 | Disposition: A | Discharge status: Home / Self Care | Payer: BLUE CROSS/BLUE SHIELD | Source: Ambulatory Visit | Attending: Interventional Radiology | Admitting: Interventional Radiology

## 2014-12-15 DIAGNOSIS — E041 Nontoxic single thyroid nodule: Secondary | ICD-10-CM

## 2014-12-15 IMAGING — US US THYROID BIOPSY
1 series · 7 of 7 positions shown · non-contrast
Comparison: Thyroid ultrasound - [DATE];

INDICATION: Indeterminate thyroid nodule

EXAM:
ULTRASOUND GUIDED FINE NEEDLE ASPIRATION OF INDETERMINATE THYROID
NODULE
TECHNIQUE: Informed written consent was obtained from the patient after a
discussion of the risks, benefits and alternatives to treatment.
Questions regarding the procedure were encouraged and answered. A
timeout was performed prior to the initiation of the procedure.

[Series 1: us thyroid biopsy · 0.07mm/px · 7 acquisitions, 7 frames shown]
[im 1/7]
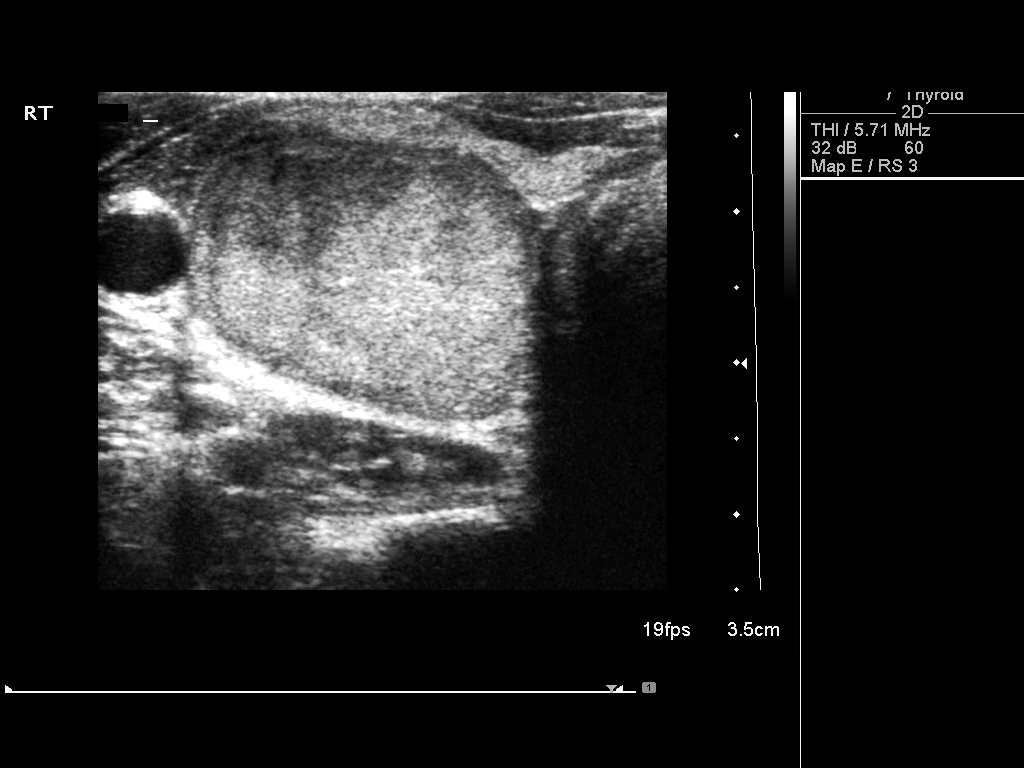
[im 2/7]
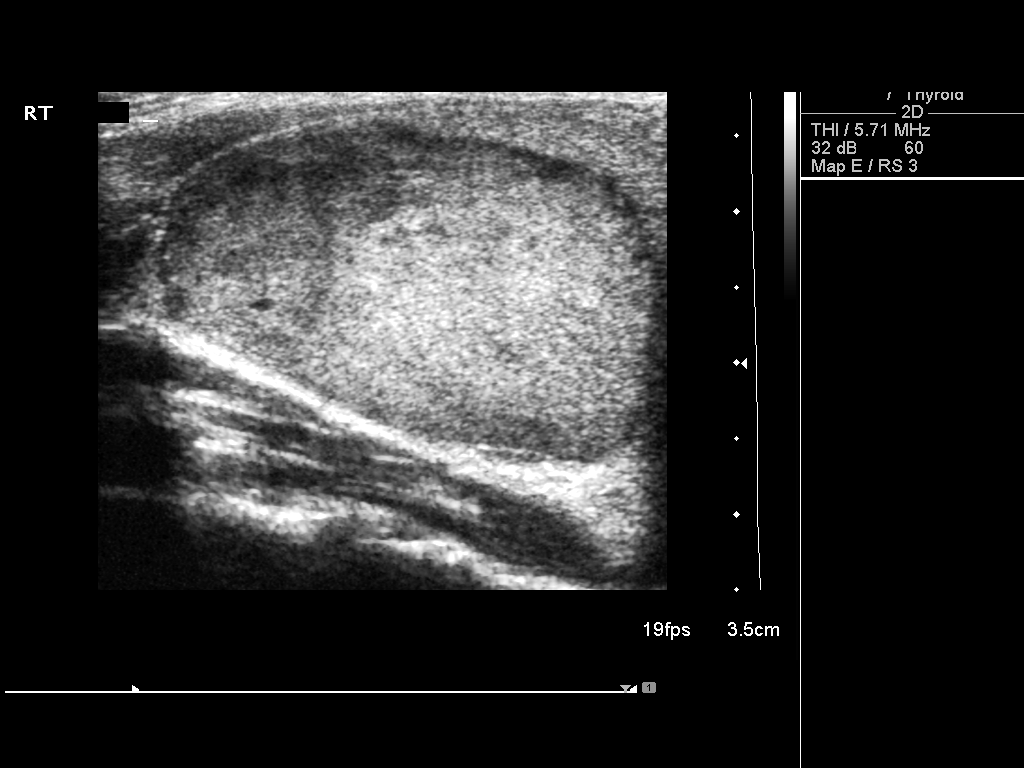
[im 3/7]
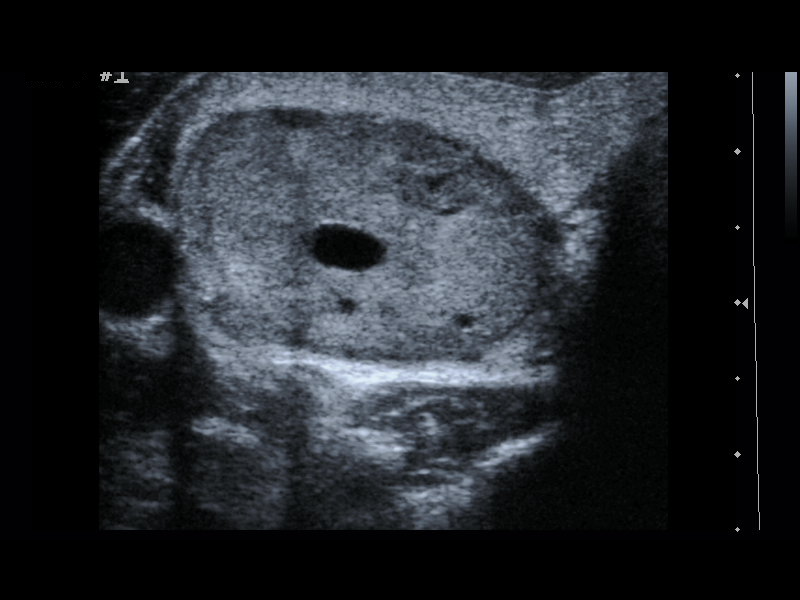
[im 4/7]
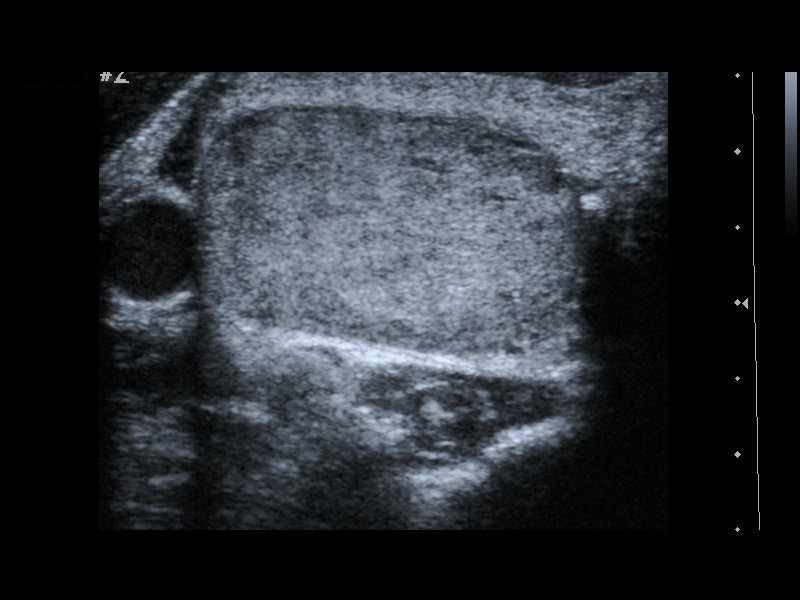
[im 5/7]
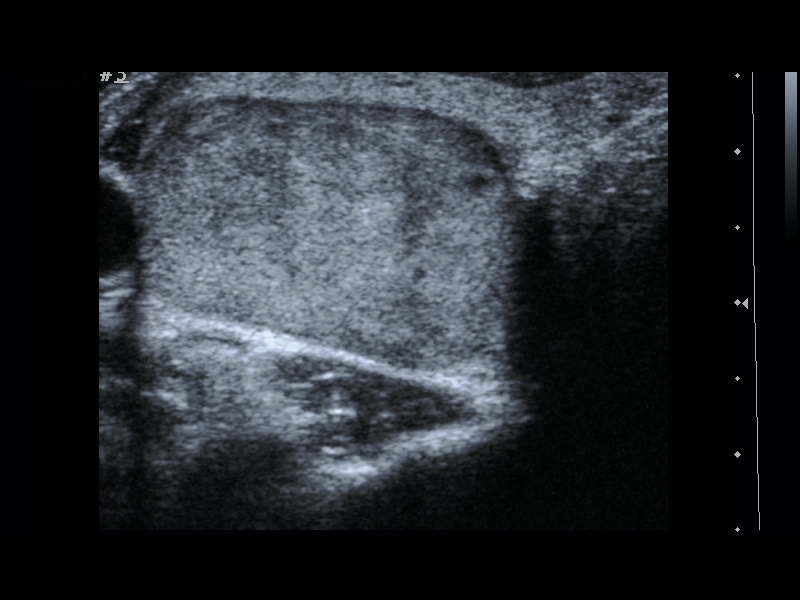
[im 6/7]
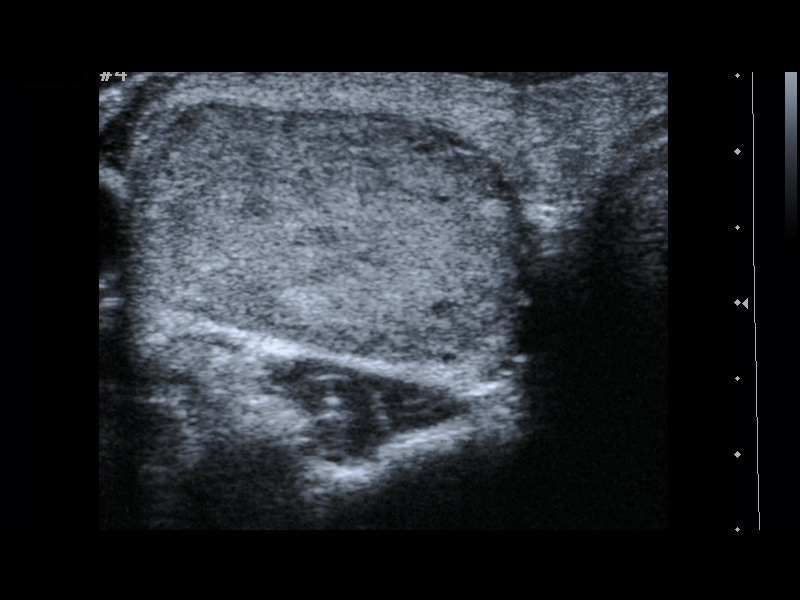
[im 7/7]
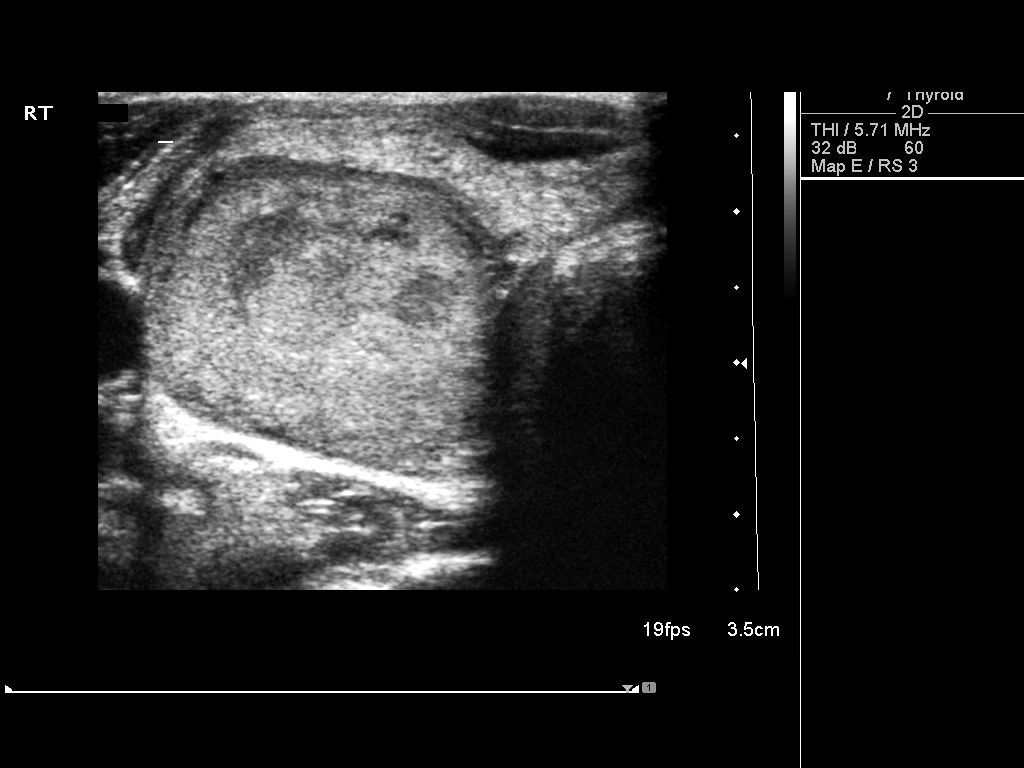

[7 of 7 positions shown; findings below may reference images not displayed]

nuclear medicine
thyroid uptake and scan - [DATE]

MEDICATIONS:
None

COMPLICATIONS:
None immediate
Pre-procedural ultrasound scanning demonstrated unchanged appearance
of dominant approximately 3.1 x 1.6 x 2.5 cm mixed echogenic,
partially cystic, predominantly solid nodule/mass within the right
lobe of the thyroid compatible with the cold nodules seen on
subsequent thyroid uptake and scan.

The procedure was planned. The neck was prepped in the usual sterile
fashion, and a sterile drape was applied covering the operative
field. A timeout was performed prior to the initiation of the
procedure. Local anesthesia was provided with 1% lidocaine.

Under direct ultrasound guidance, 4 FNA biopsies were performed of
the dominant right-sided thyroid nodule with a 27 gauge needle. The
samples were prepared and submitted to pathology.

Limited post procedural scanning was negative for hematoma or
additional complication. Dressings were placed. The patient
tolerated the above procedures procedure well without immediate
postprocedural complication.
IMPRESSION: Technically successful ultrasound guided fine needle aspiration of
dominant approximately 3.1 cm right-sided thyroid nodule/mass.

## 2015-01-18 ENCOUNTER — Ambulatory Visit: Payer: Self-pay | Admitting: Surgery

## 2015-05-17 DIAGNOSIS — S73199A Other sprain of unspecified hip, initial encounter: Secondary | ICD-10-CM | POA: Insufficient documentation

## 2015-06-02 ENCOUNTER — Ambulatory Visit: Payer: Self-pay | Admitting: Surgery

## 2015-06-24 ENCOUNTER — Encounter (HOSPITAL_COMMUNITY): Payer: Self-pay

## 2015-06-24 ENCOUNTER — Encounter (HOSPITAL_COMMUNITY)
Admission: RE | Admit: 2015-06-24 | Discharge: 2015-06-24 | Disposition: A | Discharge status: Home / Self Care | Payer: BLUE CROSS/BLUE SHIELD | Source: Ambulatory Visit | Attending: Surgery | Admitting: Surgery

## 2015-06-24 ENCOUNTER — Ambulatory Visit (HOSPITAL_COMMUNITY)
Admission: RE | Admit: 2015-06-24 | Discharge: 2015-06-24 | Disposition: A | Discharge status: Home / Self Care | Payer: BLUE CROSS/BLUE SHIELD | Source: Ambulatory Visit | Attending: Anesthesiology | Admitting: Anesthesiology

## 2015-06-24 ENCOUNTER — Encounter (HOSPITAL_COMMUNITY): Admission: RE | Admit: 2015-06-24 | Payer: BLUE CROSS/BLUE SHIELD | Source: Ambulatory Visit

## 2015-06-24 DIAGNOSIS — Z01818 Encounter for other preprocedural examination: Secondary | ICD-10-CM | POA: Insufficient documentation

## 2015-06-24 DIAGNOSIS — Z01812 Encounter for preprocedural laboratory examination: Secondary | ICD-10-CM | POA: Insufficient documentation

## 2015-06-24 DIAGNOSIS — D440 Neoplasm of uncertain behavior of thyroid gland: Secondary | ICD-10-CM | POA: Insufficient documentation

## 2015-06-24 DIAGNOSIS — D497 Neoplasm of unspecified behavior of endocrine glands and other parts of nervous system: Secondary | ICD-10-CM

## 2015-06-24 HISTORY — DX: Other complications of anesthesia, initial encounter: T88.59XA

## 2015-06-24 HISTORY — DX: Family history of other specified conditions: Z84.89

## 2015-06-24 HISTORY — DX: Personal history of other diseases of the circulatory system: Z86.79

## 2015-06-24 HISTORY — DX: Adverse effect of unspecified anesthetic, initial encounter: T41.45XA

## 2015-06-24 LAB — CBC
HEMATOCRIT: 44.1 % (ref 36.0–46.0)
Hemoglobin: 14.6 g/dL (ref 12.0–15.0)
MCH: 31.9 pg (ref 26.0–34.0)
MCHC: 33.1 g/dL (ref 30.0–36.0)
MCV: 96.3 fL (ref 78.0–100.0)
PLATELETS: 265 10*3/uL (ref 150–400)
RBC: 4.58 MIL/uL (ref 3.87–5.11)
RDW: 13.3 % (ref 11.5–15.5)
WBC: 6.9 10*3/uL (ref 4.0–10.5)

## 2015-06-24 LAB — HCG, SERUM, QUALITATIVE: Preg, Serum: NEGATIVE

## 2015-06-24 LAB — BASIC METABOLIC PANEL
ANION GAP: 7 (ref 5–15)
BUN: 11 mg/dL (ref 6–20)
CALCIUM: 9.1 mg/dL (ref 8.9–10.3)
CO2: 28 mmol/L (ref 22–32)
Chloride: 105 mmol/L (ref 101–111)
Creatinine, Ser: 0.78 mg/dL (ref 0.44–1.00)
GLUCOSE: 77 mg/dL (ref 65–99)
POTASSIUM: 3.7 mmol/L (ref 3.5–5.1)
Sodium: 140 mmol/L (ref 135–145)

## 2015-06-24 IMAGING — DX DG CHEST 2V
2 series · 2 of 2 positions shown · non-contrast
Comparison: [DATE]

CLINICAL DATA: Preoperative exam prior to thyroidectomy.

EXAM:
CHEST  2 VIEW

[chest pa]
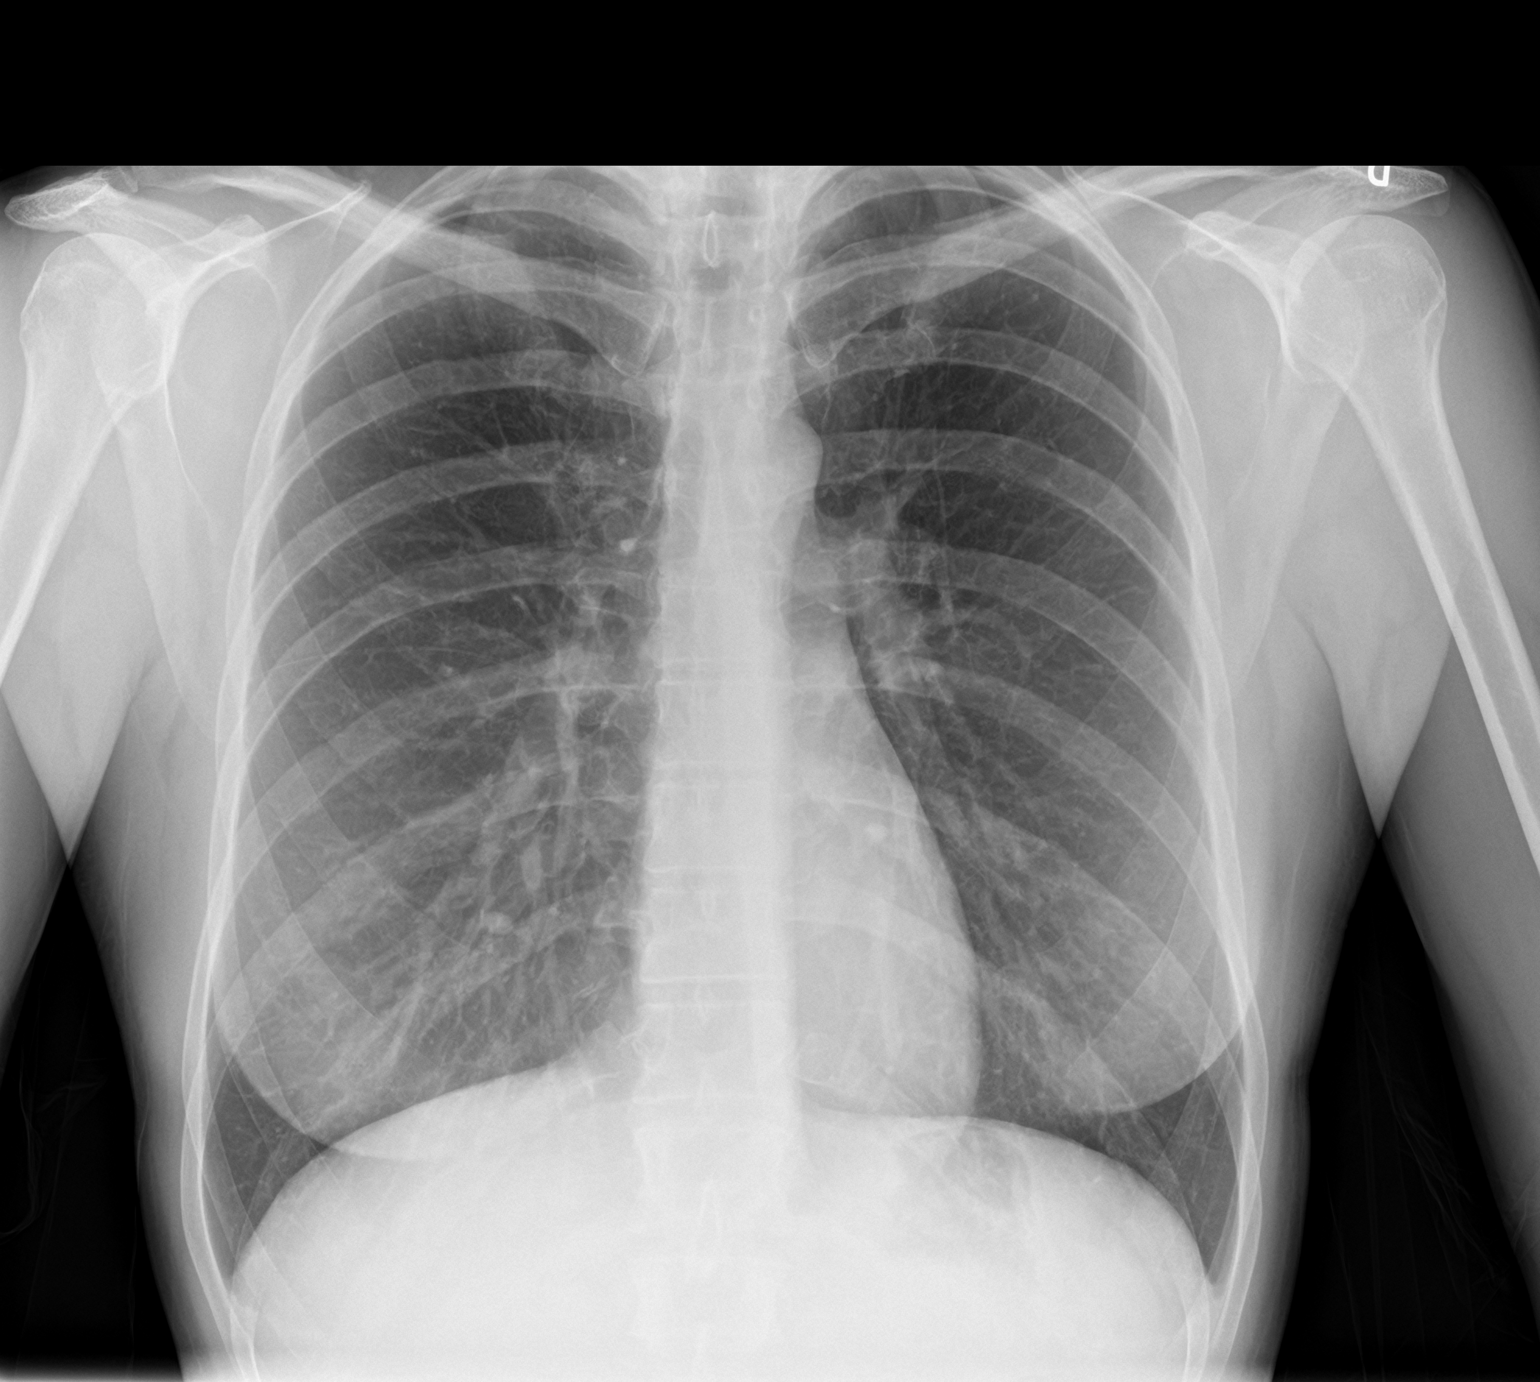

[chest lat]
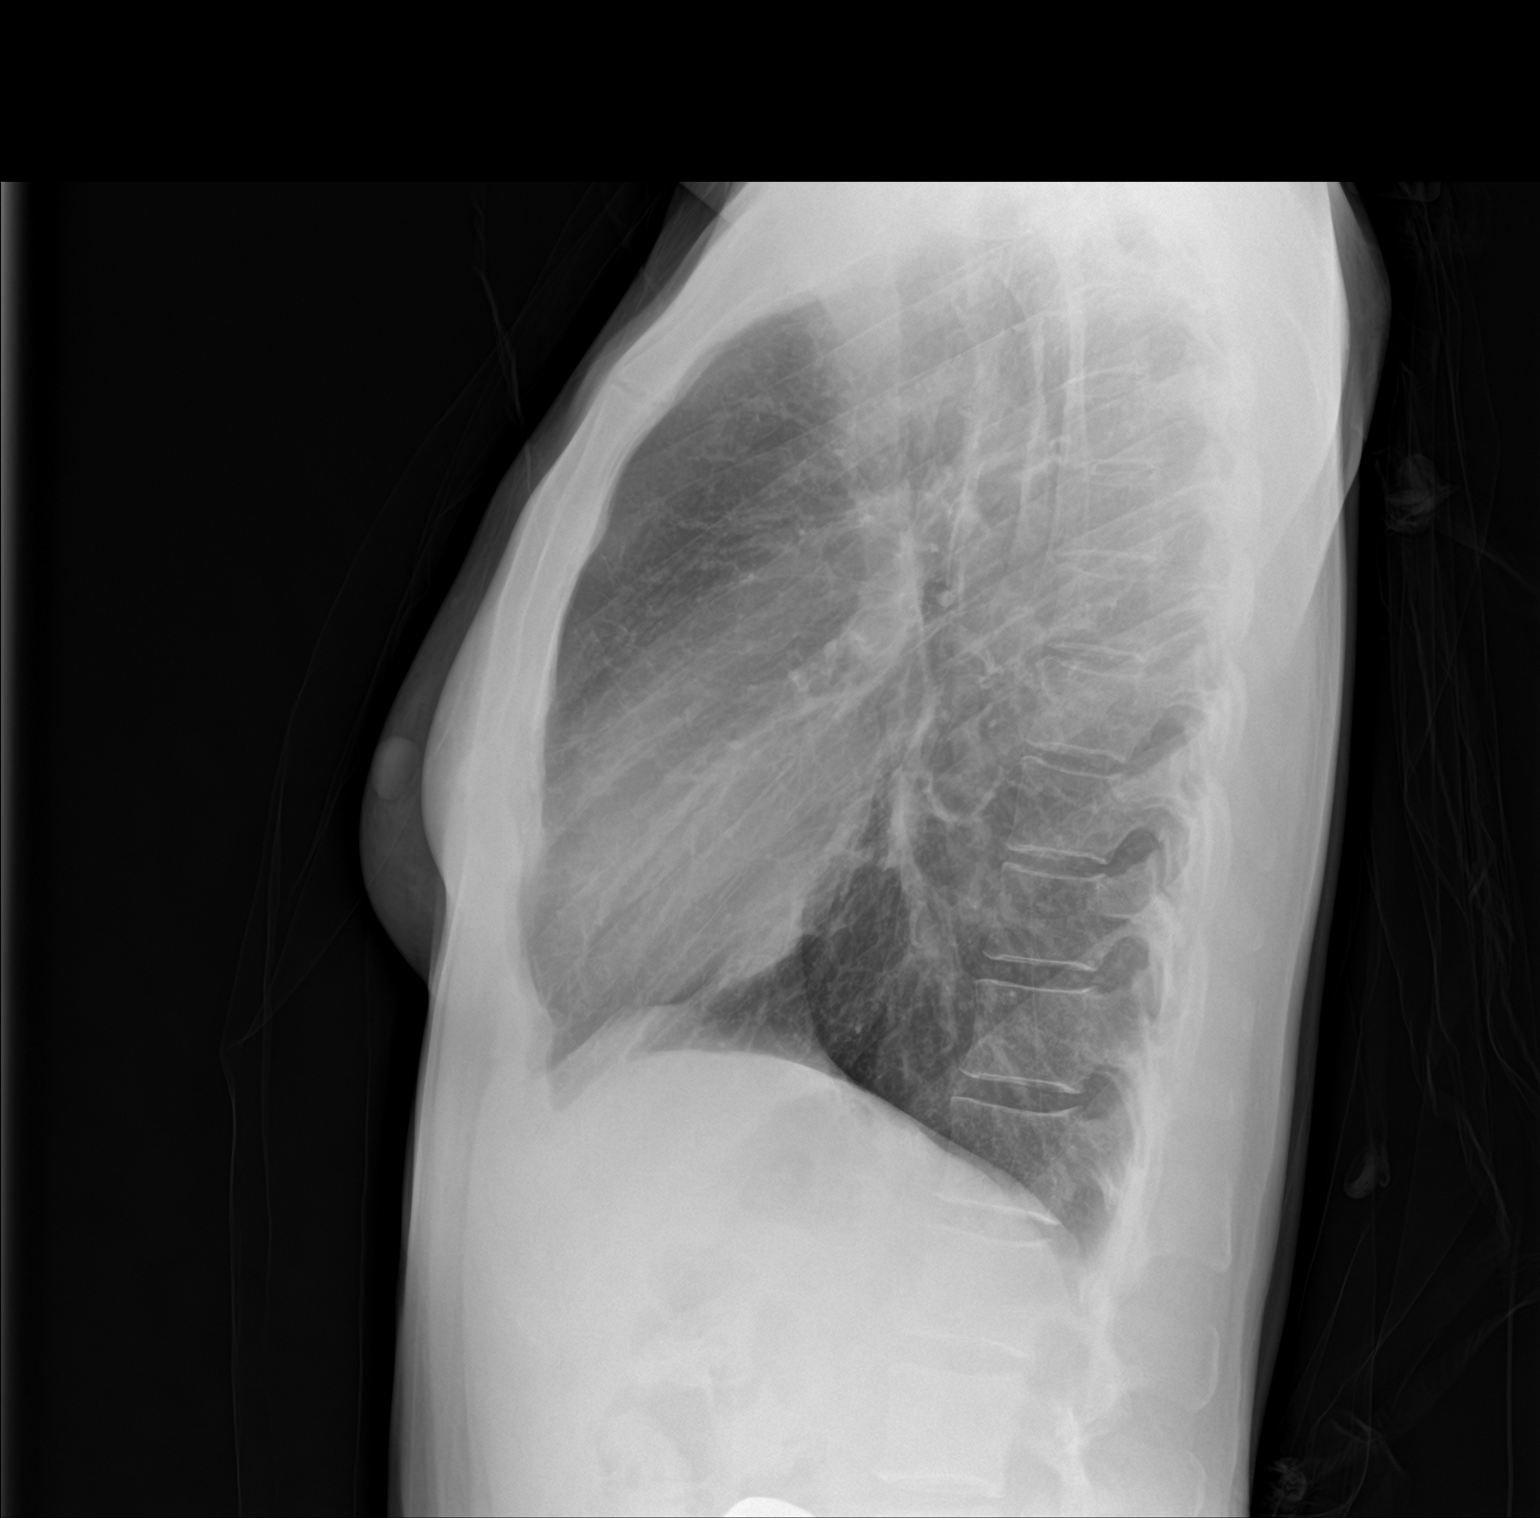

[2 of 2 positions shown; findings below may reference images not displayed]

FINDINGS: The heart size and mediastinal contours are within normal limits.
Both lungs are clear. The visualized skeletal structures are
unremarkable.
IMPRESSION: No active cardiopulmonary disease.

## 2015-06-24 NOTE — Progress Notes (Signed)
Pt did not sign surgical consent. Patient states surgery was to change from right thyroid lobectomy to entire removal of thyroid. Clarification needed and surgical consent will need to be signed day of surgery.

## 2015-06-24 NOTE — Patient Instructions (Addendum)
Jenny Taylor  06/24/2015   Your procedure is scheduled on: Thursday June 30, 2015   Report to [facility] Main  Entrance take [place]  elevators to 3rd floor to  [place] at 7:30 AM.  Call this number if you have problems the morning of surgery [phone]   Remember: ONLY 1 PERSON MAY GO WITH YOU TO SHORT STAY TO GET  READY MORNING OF [place].  Do not eat food or drink liquids :After Midnight.     Take these medicines the morning of surgery with A SIP OF WATER: Loratadine (Claritin) if needed; May use Flonase if needed (bring with you day of surgery)              Discontinue any herbal medications 5 days prior to surgery.                               You may not have any metal on your body including hair pins and              piercings  Do not wear jewelry, make-up, lotions, powders or perfumes, deodorant             Do not wear nail polish.  Do not shave  48 hours prior to surgery.                Do not bring valuables to the hospital. [place].  Contacts, dentures or bridgework may not be worn into surgery.  Leave suitcase in the car. After surgery it may be brought to your room.    _____________________________________________________________________             [facility] - Preparing for Surgery Before surgery, you can play an important role.  Because skin is not sterile, your skin needs to be as free of germs as possible.  You can reduce the number of germs on your skin by washing with CHG (chlorahexidine gluconate) soap before surgery.  CHG is an antiseptic cleaner which kills germs and bonds with the skin to continue killing germs even after washing. Please DO NOT use if you have an allergy to CHG or antibacterial soaps.  If your skin becomes reddened/irritated stop using the CHG and inform your nurse when you arrive at Short Stay. Do not shave (including legs and underarms) for at  least 48 hours prior to the first CHG shower.  You may shave your face/neck. Please follow these instructions carefully:  1.  Shower with CHG Soap the night before surgery and the  morning of Surgery.  2.  If you choose to wash your hair, wash your hair first as usual with your  normal  shampoo.  3.  After you shampoo, rinse your hair and body thoroughly to remove the  shampoo.                           4.  Use CHG as you would any other liquid soap.  You can apply chg directly  to the skin and wash                       Gently with a scrungie or  clean washcloth.  5.  Apply the CHG Soap to your body ONLY FROM THE NECK DOWN.   Do not use on face/ open                           Wound or open sores. Avoid contact with eyes, ears mouth and genitals (private parts).                       Wash face,  Genitals (private parts) with your normal soap.             6.  Wash thoroughly, paying special attention to the area where your surgery  will be performed.  7.  Thoroughly rinse your body with warm water from the neck down.  8.  DO NOT shower/wash with your normal soap after using and rinsing off  the CHG Soap.                9.  Pat yourself dry with a clean towel.            10.  Wear clean pajamas.            11.  Place clean sheets on your bed the night of your first shower and do not  sleep with pets. Day of Surgery : Do not apply any lotions/deodorants the morning of surgery.  Please wear clean clothes to the hospital/surgery center.  FAILURE TO FOLLOW THESE INSTRUCTIONS MAY RESULT IN THE CANCELLATION OF YOUR SURGERY PATIENT SIGNATURE_________________________________  NURSE SIGNATURE__________________________________  ________________________________________________________________________

## 2015-06-29 ENCOUNTER — Encounter (HOSPITAL_COMMUNITY): Payer: Self-pay | Admitting: Surgery

## 2015-06-29 ENCOUNTER — Ambulatory Visit: Payer: Self-pay | Admitting: Surgery

## 2015-06-29 NOTE — H&P (Signed)
  General Surgery [facility]  Winfield Cunas DOB: 09/17/1976 Married / Language: Jenny Taylor / Race: White Female  History of Present Illness  The patient is a 39 year old female who presents with a thyroid nodule. Patient returns to review further diagnostic studies and to make a final decision regarding thyroid surgery. Patient was previously evaluated for a right thyroid nodule of undetermined significance. Her initial cytopathology was a follicular lesion, Bethesda category IV. Patient sought a second opinion from Dr. Lavone Orn and underwent a repeat fine-needle aspiration biopsy with [facility] testing. This demonstrated a follicular lesion of undetermined significance, Bethesda category III, with the [facility] test being suspicious, representing a risk of malignancy of at least 40%. Patient now returns to discuss surgical intervention for definitive diagnosis. Ultrasound shows the dominant nodule on the right side as well as 2 small nodules in the left thyroid lobe. These have not been biopsied. Patient notes that the right-sided nodule has become somewhat larger and more firm over the past several months. She has not noted any sign of lymphadenopathy. She denies any pain. She denies any compressive symptoms. She denies tremors or palpitations.   Allergies Macrobid *URINARY ANTI-INFECTIVES* Swelling.  Medication History No Current Medications Medications Reconciled  Vitals 06/02/2015 3:16 PM Weight: 129 lb Height: 64in Body Surface Area: 1.63 m Body Mass Index: 22.14 kg/m Temp.: 98.39F(Temporal)  Pulse: 84 (Regular)  BP: 128/78 (Sitting, Left Arm, Standard)    Physical Exam  General - appears comfortable, no distress; not diaphorectic  HEENT - normocephalic; sclerae clear, gaze conjugate; mucous membranes moist, dentition good; voice normal  Neck - asymmetric on extension; no palpable anterior or posterior cervical adenopathy; left  thyroid lobe without palpable abnormality; right thyroid lobe is dominated by a firm smooth mass measuring at least 3 cm in greatest diameter, mobile with swallowing, and nontender  Chest - clear bilaterally without rhonchi, rales, or wheeze  Cor - regular rhythm with normal rate; no significant murmur  Ext - non-tender without significant edema or lymphedema  Neuro - grossly intact; no tremor   Assessment & Plan  NEOPLASM OF UNCERTAIN BEHAVIOR OF THYROID GLAND (D44.0)  Patient returns having undergone additional biopsy and testing. She does appear to have a follicular lesion of undetermined significance with a risk of malignancy of approximately 40%.  Patient I discussed options for surgery at length today. Her choices are really between having a right thyroid lobectomy and a total thyroidectomy. Patient desires total thyroidectomy for definitive diagnosis and management. We have discussed the pros and cons of this procedure.  We discussed the hospital stay to be anticipated and her postoperative recovery. Patient understands and wishes to proceed with surgery in the near future.  The risks and benefits of the procedure have been discussed at length with the patient. The patient understands the proposed procedure, potential alternative treatments, and the course of recovery to be expected. All of the patient's questions have been answered at this time. The patient wishes to proceed with surgery.  Earnstine Regal, MD, [facility] Office: [phone]

## 2015-06-30 ENCOUNTER — Ambulatory Visit (HOSPITAL_COMMUNITY): Payer: BLUE CROSS/BLUE SHIELD | Admitting: Certified Registered Nurse Anesthetist

## 2015-06-30 ENCOUNTER — Encounter (HOSPITAL_COMMUNITY)
Admission: RE | Disposition: A | Discharge status: Home / Self Care | Payer: Self-pay | Source: Ambulatory Visit | Attending: Surgery

## 2015-06-30 ENCOUNTER — Observation Stay (HOSPITAL_COMMUNITY)
Admission: RE | Admit: 2015-06-30 | Discharge: 2015-07-01 | Disposition: A | Discharge status: Home / Self Care | Payer: BLUE CROSS/BLUE SHIELD | Source: Ambulatory Visit | Attending: Surgery | Admitting: Surgery

## 2015-06-30 ENCOUNTER — Encounter (HOSPITAL_COMMUNITY): Payer: Self-pay | Admitting: *Deleted

## 2015-06-30 DIAGNOSIS — C73 Malignant neoplasm of thyroid gland: Principal | ICD-10-CM | POA: Insufficient documentation

## 2015-06-30 DIAGNOSIS — Z888 Allergy status to other drugs, medicaments and biological substances status: Secondary | ICD-10-CM | POA: Insufficient documentation

## 2015-06-30 DIAGNOSIS — I341 Nonrheumatic mitral (valve) prolapse: Secondary | ICD-10-CM | POA: Diagnosis not present

## 2015-06-30 DIAGNOSIS — D440 Neoplasm of uncertain behavior of thyroid gland: Secondary | ICD-10-CM

## 2015-06-30 DIAGNOSIS — D489 Neoplasm of uncertain behavior, unspecified: Secondary | ICD-10-CM | POA: Diagnosis present

## 2015-06-30 HISTORY — PX: THYROIDECTOMY: SHX17

## 2015-06-30 SURGERY — THYROIDECTOMY
Anesthesia: General | Site: Neck

## 2015-06-30 MED ORDER — GLYCOPYRROLATE 0.2 MG/ML IJ SOLN
INTRAMUSCULAR | Status: DC | PRN
  Administered 2015-06-30: 0.4 mg via INTRAVENOUS

## 2015-06-30 MED ORDER — SUCCINYLCHOLINE CHLORIDE 20 MG/ML IJ SOLN
INTRAMUSCULAR | Status: DC | PRN
  Administered 2015-06-30: 100 mg via INTRAVENOUS

## 2015-06-30 MED ORDER — LACTATED RINGERS IV SOLN
INTRAVENOUS | Status: DC
  Administered 2015-06-30 (×2): via INTRAVENOUS

## 2015-06-30 MED ORDER — LIDOCAINE HCL (CARDIAC) 20 MG/ML IV SOLN
INTRAVENOUS | Status: DC | PRN
  Administered 2015-06-30: 50 mg via INTRAVENOUS

## 2015-06-30 MED ORDER — PROPOFOL 10 MG/ML IV BOLUS
INTRAVENOUS | Status: DC | PRN
  Administered 2015-06-30: 180 mg via INTRAVENOUS

## 2015-06-30 MED ORDER — FENTANYL CITRATE (PF) 250 MCG/5ML IJ SOLN
INTRAMUSCULAR | Status: AC
  Filled 2015-06-30: qty 25

## 2015-06-30 MED ORDER — HYDROMORPHONE HCL 1 MG/ML IJ SOLN
1.0000 mg | INTRAMUSCULAR | Status: DC | PRN
  Administered 2015-06-30: 1 mg via INTRAVENOUS
  Filled 2015-06-30: qty 1

## 2015-06-30 MED ORDER — MIDAZOLAM HCL 2 MG/2ML IJ SOLN
INTRAMUSCULAR | Status: AC
  Filled 2015-06-30: qty 4

## 2015-06-30 MED ORDER — CEFAZOLIN SODIUM-DEXTROSE 2-3 GM-% IV SOLR
2.0000 g | INTRAVENOUS | Status: AC
  Administered 2015-06-30: 2 g via INTRAVENOUS

## 2015-06-30 MED ORDER — LACTATED RINGERS IV SOLN: INTRAVENOUS | Status: DC

## 2015-06-30 MED ORDER — ONDANSETRON HCL 4 MG/2ML IJ SOLN
4.0000 mg | Freq: Four times a day (QID) | INTRAMUSCULAR | Status: DC | PRN
  Administered 2015-06-30: 4 mg via INTRAVENOUS
  Filled 2015-06-30 (×2): qty 2

## 2015-06-30 MED ORDER — PHENYLEPHRINE 40 MCG/ML (10ML) SYRINGE FOR IV PUSH (FOR BLOOD PRESSURE SUPPORT)
PREFILLED_SYRINGE | INTRAVENOUS | Status: AC
  Filled 2015-06-30: qty 10

## 2015-06-30 MED ORDER — ACETAMINOPHEN 650 MG RE SUPP: 650.0000 mg | Freq: Four times a day (QID) | RECTAL | Status: DC | PRN

## 2015-06-30 MED ORDER — CEFAZOLIN SODIUM-DEXTROSE 2-3 GM-% IV SOLR
INTRAVENOUS | Status: AC
  Filled 2015-06-30: qty 50

## 2015-06-30 MED ORDER — MIDAZOLAM HCL 5 MG/5ML IJ SOLN
INTRAMUSCULAR | Status: DC | PRN
  Administered 2015-06-30 (×2): 1 mg via INTRAVENOUS

## 2015-06-30 MED ORDER — FENTANYL CITRATE (PF) 100 MCG/2ML IJ SOLN
INTRAMUSCULAR | Status: DC | PRN
  Administered 2015-06-30: 100 ug via INTRAVENOUS
  Administered 2015-06-30 (×3): 50 ug via INTRAVENOUS

## 2015-06-30 MED ORDER — DEXAMETHASONE SODIUM PHOSPHATE 10 MG/ML IJ SOLN
INTRAMUSCULAR | Status: DC | PRN
Start: 2015-06-30 — End: 2015-06-30
  Administered 2015-06-30: 10 mg via INTRAVENOUS

## 2015-06-30 MED ORDER — ONDANSETRON HCL 4 MG/2ML IJ SOLN
INTRAMUSCULAR | Status: AC
  Filled 2015-06-30: qty 2

## 2015-06-30 MED ORDER — LIDOCAINE HCL (CARDIAC) 20 MG/ML IV SOLN
INTRAVENOUS | Status: AC
  Filled 2015-06-30: qty 5

## 2015-06-30 MED ORDER — KCL IN DEXTROSE-NACL 20-5-0.45 MEQ/L-%-% IV SOLN
INTRAVENOUS | Status: DC
  Administered 2015-06-30: 13:00:00 via INTRAVENOUS
  Filled 2015-06-30 (×2): qty 1000

## 2015-06-30 MED ORDER — PROMETHAZINE HCL 25 MG/ML IJ SOLN
6.2500 mg | INTRAMUSCULAR | Status: DC | PRN
  Administered 2015-06-30: 6.25 mg via INTRAVENOUS

## 2015-06-30 MED ORDER — ROCURONIUM BROMIDE 100 MG/10ML IV SOLN
INTRAVENOUS | Status: DC | PRN
  Administered 2015-06-30 (×2): 10 mg via INTRAVENOUS
  Administered 2015-06-30: 5 mg via INTRAVENOUS
  Administered 2015-06-30: 10 mg via INTRAVENOUS
  Administered 2015-06-30: 25 mg via INTRAVENOUS

## 2015-06-30 MED ORDER — MEPERIDINE HCL 50 MG/ML IJ SOLN: 6.2500 mg | INTRAMUSCULAR | Status: DC | PRN

## 2015-06-30 MED ORDER — PROPOFOL 10 MG/ML IV BOLUS
INTRAVENOUS | Status: AC
  Filled 2015-06-30: qty 20

## 2015-06-30 MED ORDER — PROMETHAZINE HCL 25 MG/ML IJ SOLN
INTRAMUSCULAR | Status: AC
  Filled 2015-06-30: qty 1

## 2015-06-30 MED ORDER — ONDANSETRON 4 MG PO TBDP: 4.0000 mg | ORAL_TABLET | Freq: Four times a day (QID) | ORAL | Status: DC | PRN

## 2015-06-30 MED ORDER — GLYCOPYRROLATE 0.2 MG/ML IJ SOLN
INTRAMUSCULAR | Status: AC
  Filled 2015-06-30: qty 3

## 2015-06-30 MED ORDER — ROCURONIUM BROMIDE 100 MG/10ML IV SOLN
INTRAVENOUS | Status: AC
  Filled 2015-06-30: qty 1

## 2015-06-30 MED ORDER — NEOSTIGMINE METHYLSULFATE 10 MG/10ML IV SOLN
INTRAVENOUS | Status: AC
  Filled 2015-06-30: qty 1

## 2015-06-30 MED ORDER — FENTANYL CITRATE (PF) 100 MCG/2ML IJ SOLN
25.0000 ug | INTRAMUSCULAR | Status: DC | PRN
  Administered 2015-06-30 (×2): 25 ug via INTRAVENOUS

## 2015-06-30 MED ORDER — ONDANSETRON HCL 4 MG/2ML IJ SOLN
INTRAMUSCULAR | Status: DC | PRN
  Administered 2015-06-30: 4 mg via INTRAVENOUS

## 2015-06-30 MED ORDER — NEOSTIGMINE METHYLSULFATE 10 MG/10ML IV SOLN
INTRAVENOUS | Status: DC | PRN
  Administered 2015-06-30: 3 mg via INTRAVENOUS

## 2015-06-30 MED ORDER — ACETAMINOPHEN 325 MG PO TABS: 650.0000 mg | ORAL_TABLET | Freq: Four times a day (QID) | ORAL | Status: DC | PRN

## 2015-06-30 MED ORDER — DEXAMETHASONE SODIUM PHOSPHATE 10 MG/ML IJ SOLN
INTRAMUSCULAR | Status: AC
  Filled 2015-06-30: qty 1

## 2015-06-30 MED ORDER — CALCIUM CARBONATE 1250 (500 CA) MG PO TABS
2.0000 | ORAL_TABLET | Freq: Three times a day (TID) | ORAL | Status: DC
  Administered 2015-06-30 – 2015-07-01 (×3): 1000 mg via ORAL
  Filled 2015-06-30 (×6): qty 2

## 2015-06-30 MED ORDER — HYDROCODONE-ACETAMINOPHEN 5-325 MG PO TABS
1.0000 | ORAL_TABLET | ORAL | Status: DC | PRN
  Administered 2015-06-30: 1 via ORAL
  Administered 2015-06-30 – 2015-07-01 (×4): 2 via ORAL
  Filled 2015-06-30: qty 1
  Filled 2015-06-30 (×4): qty 2

## 2015-06-30 MED ORDER — 0.9 % SODIUM CHLORIDE (POUR BTL) OPTIME
TOPICAL | Status: DC | PRN
  Administered 2015-06-30: 1000 mL

## 2015-06-30 MED ORDER — FENTANYL CITRATE (PF) 100 MCG/2ML IJ SOLN
INTRAMUSCULAR | Status: AC
  Filled 2015-06-30: qty 2

## 2015-06-30 MED ORDER — PHENYLEPHRINE HCL 10 MG/ML IJ SOLN
INTRAMUSCULAR | Status: DC | PRN
  Administered 2015-06-30: 40 ug via INTRAVENOUS

## 2015-06-30 SURGICAL SUPPLY — 37 items
APL SKNCLS STERI-STRIP NONHPOA (GAUZE/BANDAGES/DRESSINGS) ×1
ATTRACTOMAT 16X20 MAGNETIC DRP (DRAPES) ×2 IMPLANT
BENZOIN TINCTURE PRP APPL 2/3 (GAUZE/BANDAGES/DRESSINGS) ×1 IMPLANT
BLADE HEX COATED 2.75 (ELECTRODE) ×2 IMPLANT
BLADE SURG 15 STRL LF DISP TIS (BLADE) ×1 IMPLANT
BLADE SURG 15 STRL SS (BLADE) ×2
CHLORAPREP W/TINT 26ML (MISCELLANEOUS) ×2 IMPLANT
CLIP TI MEDIUM 6 (CLIP) ×6 IMPLANT
CLIP TI WIDE RED SMALL 6 (CLIP) ×6 IMPLANT
COVER SURGICAL LIGHT HANDLE (MISCELLANEOUS) ×2 IMPLANT
DISSECTOR ROUND CHERRY 3/8 STR (MISCELLANEOUS) IMPLANT
DRAPE LAPAROTOMY T 98X78 PEDS (DRAPES) ×2 IMPLANT
DRESSING SURGICEL FIBRLLR 1X2 (HEMOSTASIS) ×1 IMPLANT
DRSG SURGICEL FIBRILLAR 1X2 (HEMOSTASIS) ×2
ELECT PENCIL ROCKER SW 15FT (MISCELLANEOUS) ×2 IMPLANT
ELECT REM PT RETURN 9FT ADLT (ELECTROSURGICAL) ×2
ELECTRODE REM PT RTRN 9FT ADLT (ELECTROSURGICAL) ×1 IMPLANT
GAUZE SPONGE 4X4 12PLY STRL (GAUZE/BANDAGES/DRESSINGS) ×1 IMPLANT
GAUZE SPONGE 4X4 16PLY XRAY LF (GAUZE/BANDAGES/DRESSINGS) ×2 IMPLANT
GLOVE SURG ORTHO 8.0 STRL STRW (GLOVE) ×2 IMPLANT
GOWN STRL REUS W/TWL XL LVL3 (GOWN DISPOSABLE) ×4 IMPLANT
KIT BASIN OR (CUSTOM PROCEDURE TRAY) ×2 IMPLANT
LIQUID BAND (GAUZE/BANDAGES/DRESSINGS) ×1 IMPLANT
PACK BASIC VI WITH GOWN DISP (CUSTOM PROCEDURE TRAY) ×2 IMPLANT
SHEARS HARMONIC 9CM CVD (BLADE) ×2 IMPLANT
STAPLER VISISTAT 35W (STAPLE) IMPLANT
STRIP CLOSURE SKIN 1/2X4 (GAUZE/BANDAGES/DRESSINGS) ×2 IMPLANT
SUT MNCRL AB 4-0 PS2 18 (SUTURE) ×2 IMPLANT
SUT SILK 2 0 (SUTURE)
SUT SILK 2-0 18XBRD TIE 12 (SUTURE) IMPLANT
SUT SILK 3 0 (SUTURE)
SUT SILK 3-0 18XBRD TIE 12 (SUTURE) IMPLANT
SUT VIC AB 3-0 SH 18 (SUTURE) ×4 IMPLANT
SYR BULB IRRIGATION 50ML (SYRINGE) ×2 IMPLANT
TOWEL OR 17X26 10 PK STRL BLUE (TOWEL DISPOSABLE) ×2 IMPLANT
TOWEL OR NON WOVEN STRL DISP B (DISPOSABLE) ×2 IMPLANT
YANKAUER SUCT BULB TIP 10FT TU (MISCELLANEOUS) ×2 IMPLANT

## 2015-06-30 NOTE — Transfer of Care (Signed)
Immediate Anesthesia Transfer of Care Note  Patient: Jenny Taylor  Procedure(s) Performed: Procedure(s): Total THYROIDECTOMY (N/A)  Patient Location: PACU  Anesthesia Type:General  Level of Consciousness: awake, alert  and oriented  Airway & Oxygen Therapy: Patient Spontanous Breathing and Patient connected to face mask oxygen  Post-op Assessment: Report given to RN and Post -op Vital signs reviewed and stable  Post vital signs: Reviewed and stable  Last Vitals:  Filed Vitals:   06/30/15 0737  BP: 120/81  Pulse: 77  Temp: 36.7 C  Resp: 18    Complications: No apparent anesthesia complications

## 2015-06-30 NOTE — Anesthesia Preprocedure Evaluation (Signed)
Anesthesia Evaluation  Patient identified by MRN, date of birth, ID band Patient awake    Reviewed: Allergy & Precautions, NPO status , Patient's Chart, lab work & pertinent test results  Airway Mallampati: II  TM Distance: >3 FB Neck ROM: Full    Dental no notable dental hx.    Pulmonary neg pulmonary ROS,    Pulmonary exam normal breath sounds clear to auscultation       Cardiovascular negative cardio ROS Normal cardiovascular exam+ Valvular Problems/Murmurs MVP  Rhythm:Regular Rate:Normal     Neuro/Psych negative neurological ROS  negative psych ROS   GI/Hepatic negative GI ROS, Neg liver ROS,   Endo/Other  negative endocrine ROS  Renal/GU negative Renal ROS  negative genitourinary   Musculoskeletal negative musculoskeletal ROS (+)   Abdominal   Peds negative pediatric ROS (+)  Hematology negative hematology ROS (+)   Anesthesia Other Findings   Reproductive/Obstetrics negative OB ROS                             Anesthesia Physical Anesthesia Plan  ASA: I  Anesthesia Plan: General   Post-op Pain Management:    Induction: Intravenous  Airway Management Planned: Oral ETT  Additional Equipment:   Intra-op Plan:   Post-operative Plan: Extubation in OR  Informed Consent: I have reviewed the patients History and Physical, chart, labs and discussed the procedure including the risks, benefits and alternatives for the proposed anesthesia with the patient or authorized representative who has indicated his/her understanding and acceptance.   Dental advisory given  Plan Discussed with: CRNA  Anesthesia Plan Comments:         Anesthesia Quick Evaluation

## 2015-06-30 NOTE — Anesthesia Postprocedure Evaluation (Signed)
  Anesthesia Post-op Note  Patient: Jenny Taylor  Procedure(s) Performed: Procedure(s) (LRB): Total THYROIDECTOMY (N/A)  Patient Location: PACU  Anesthesia Type: General  Level of Consciousness: awake and alert   Airway and Oxygen Therapy: Patient Spontanous Breathing  Post-op Pain: mild  Post-op Assessment: Post-op Vital signs reviewed, Patient's Cardiovascular Status Stable, Respiratory Function Stable, Patent Airway and No signs of Nausea or vomiting  Last Vitals:  Filed Vitals:   06/30/15 1300  BP: 112/65  Pulse: 88  Temp: 36.4 C  Resp: 16    Post-op Vital Signs: stable   Complications: No apparent anesthesia complications

## 2015-06-30 NOTE — Interval H&P Note (Signed)
History and Physical Interval Note:  06/30/2015 9:12 AM  Jenny Taylor  has presented today for surgery, with the diagnosis of Thyroid Neoplasm of Uncertain Behavior.  The various methods of treatment have been discussed with the patient and family. After consideration of risks, benefits and other options for treatment, the patient has consented to    Procedure(s): Total THYROIDECTOMY (N/A) as a surgical intervention .    The patient's history has been reviewed, patient examined, no change in status, stable for surgery.  I have reviewed the patient's chart and labs.  Questions were answered to the patient's satisfaction.    Earnstine Regal, MD, [facility] Office: [place]

## 2015-06-30 NOTE — Anesthesia Procedure Notes (Signed)
Procedure Name: Intubation Date/Time: 06/30/2015 9:33 AM Performed by: Noralyn Pick D Pre-anesthesia Checklist: Patient identified, Emergency Drugs available, Suction available and Patient being monitored Patient Re-evaluated:Patient Re-evaluated prior to inductionOxygen Delivery Method: Circle System Utilized Preoxygenation: Pre-oxygenation with 100% oxygen Intubation Type: IV induction Ventilation: Mask ventilation without difficulty Laryngoscope Size: Mac and 3 Grade View: Grade I Tube type: Oral Tube size: 7.0 mm Number of attempts: 1 Airway Equipment and Method: Stylet and Oral airway Placement Confirmation: ETT inserted through vocal cords under direct vision,  positive ETCO2 and breath sounds checked- equal and bilateral Secured at: 21 cm Tube secured with: Tape Dental Injury: Teeth and Oropharynx as per pre-operative assessment

## 2015-06-30 NOTE — Op Note (Signed)
Procedure Note  Pre-operative Diagnosis:  Thyroid neoplasm of uncertain behavior, bilateral thyroid nodules  Post-operative Diagnosis:  same  Surgeon:  Earnstine Regal, MD, FACS  Assistant:  Alphonsa Overall, MD, FACS   Procedure:  Total thyroidectomy  Anesthesia:  General  Estimated Blood Loss:  minimal  Drains: none         Specimen: thyroid to pathology  Indications:  The patient is a 39 year old female who presents with a thyroid nodule. Patient returns to review further diagnostic studies and to make a final decision regarding thyroid surgery. Patient was previously evaluated for a right thyroid nodule of undetermined significance. Her initial cytopathology was a follicular lesion, Bethesda category IV. Patient sought a second opinion from Dr. Lavone Orn and underwent a repeat fine-needle aspiration biopsy with [facility] testing. This demonstrated a follicular lesion of undetermined significance, Bethesda category III, with the [facility] test being suspicious, representing a risk of malignancy of at least 40%. Patient now returns to discuss surgical intervention for definitive diagnosis. Ultrasound shows the dominant nodule on the right side as well as 2 small nodules in the left thyroid lobe. These have not been biopsied. Patient notes that the right-sided nodule has become somewhat larger and more firm over the past several months.  Patient now comes to surgery for total thyroidectomy.  Procedure Details: Procedure was done in OR #4 at the [facility].  The patient was brought to the operating room and placed in a supine position on the operating room table.  Following administration of general anesthesia, the patient was positioned and then prepped and draped in the usual aseptic fashion.  After ascertaining that an adequate level of anesthesia had been achieved, a Kocher incision was made with #15 blade.  Dissection was carried through subcutaneous tissues and platysma.  Hemostasis was achieved with the electrocautery.  Skin flaps were elevated cephalad and caudad from the thyroid notch to the sternal notch.  The [place] self-retaining retractor was placed for exposure.  Strap muscles were incised in the midline and dissection was begun on the left side.  Strap muscles were reflected laterally.  Left thyroid lobe was moderately enlarged with soft nodules.  The left lobe was gently mobilized with blunt dissection.  Superior pole vessels were dissected out and divided individually between small and medium Ligaclips with the Harmonic scalpel.  The thyroid lobe was rolled anteriorly.  Branches of the inferior thyroid artery were divided between small Ligaclips with the Harmonic scalpel.  Inferior venous tributaries were divided between Ligaclips.  Both the superior and inferior parathyroid glands were identified and preserved on their vascular pedicles.  The recurrent laryngeal nerve was identified and preserved along its course.  The ligament of Gwenlyn Found was released with the electrocautery and the gland was mobilized onto the anterior trachea. Isthmus was mobilized across the midline.  There was a small pyramidal lobe present which was dissected off the thyroid cartilage and resected with the isthmus.  Dry pack was placed in the left neck.  Next, the right thyroid lobe was gently mobilized with blunt dissection.  Right thyroid lobe was moderately enlarged with a dominant nodule near the superior pole.  Superior pole vessels were dissected out and divided between small and medium Ligaclips with the Harmonic scalpel.  Superior parathyroid was identified and preserved.  Inferior venous tributaries were divided between medium Ligaclips with the Harmonic scalpel.  The right thyroid lobe was rolled anteriorly and the branches of the inferior thyroid artery divided between small  Ligaclips.  The right recurrent laryngeal nerve was identified and preserved along its course.  The ligament of  Gwenlyn Found was released with the electrocautery.  The right thyroid lobe was mobilized onto the anterior trachea and the remainder of the thyroid was dissected off the anterior trachea and the thyroid was completely excised.  A suture was used to mark the right lobe. The entire thyroid gland was submitted to pathology for review.  The neck was irrigated with warm saline.  Fibrillar was placed throughout the operative field.  Strap muscles were reapproximated in the midline with interrupted 3-0 Vicryl sutures.  Platysma was closed with interrupted 3-0 Vicryl sutures.  Skin was closed with a running 4-0 Monocryl subcuticular suture.  Wound was washed and dried and benzoin and steri-strips were applied.  Dry gauze dressing was placed.  The patient was awakened from anesthesia and brought to the recovery room.  The patient tolerated the procedure well.   Earnstine Regal, MD, [facility] Office: [phone]

## 2015-07-01 DIAGNOSIS — C73 Malignant neoplasm of thyroid gland: Secondary | ICD-10-CM | POA: Diagnosis not present

## 2015-07-01 LAB — BASIC METABOLIC PANEL
Anion gap: 4 — ABNORMAL LOW (ref 5–15)
BUN: 7 mg/dL (ref 6–20)
CALCIUM: 8.8 mg/dL — AB (ref 8.9–10.3)
CO2: 30 mmol/L (ref 22–32)
Chloride: 104 mmol/L (ref 101–111)
Creatinine, Ser: 0.67 mg/dL (ref 0.44–1.00)
GFR calc Af Amer: >60 mL/min (ref >60)
GFR calc non Af Amer: >60 mL/min (ref >60)
GLUCOSE: 103 mg/dL — AB (ref 65–99)
POTASSIUM: 4 mmol/L (ref 3.5–5.1)
Sodium: 138 mmol/L (ref 135–145)

## 2015-07-01 MED ORDER — HYDROCODONE-ACETAMINOPHEN 5-325 MG PO TABS: 1.0000 | ORAL_TABLET | ORAL | Status: DC | PRN

## 2015-07-01 MED ORDER — SYNTHROID 100 MCG PO TABS: 100.0000 ug | ORAL_TABLET | Freq: Every day | ORAL | Status: DC

## 2015-07-01 MED ORDER — CALCIUM CARBONATE 1250 (500 CA) MG PO TABS: 2.0000 | ORAL_TABLET | Freq: Two times a day (BID) | ORAL | Status: DC

## 2015-07-01 NOTE — Progress Notes (Signed)
Patient's vitals are wnl, tolerating both pain medication and diet. Discussed discharge instructions with both patient and husband. Neither had questions nor complaints. Discharged to home

## 2015-07-01 NOTE — Discharge Summary (Signed)
Physician Discharge Summary [facility]  Patient ID: Jenny Taylor MRN: [id number] DOB/AGE: 1976/02/19 39 y.o.  Admit date: 06/30/2015 Discharge date: 07/01/2015  Admission Diagnoses:  Thyroid neoplasm of uncertain behavior  Discharge Diagnoses:  Principal Problem:   Neoplasm of uncertain behavior of thyroid gland   Discharged Condition: good  Hospital Course: Patient was admitted for observation following thyroid surgery.  Post op course was uncomplicated.  Pain was well controlled.  Tolerated diet.  Post op calcium level on morning following surgery was 8.8 mg/dl.  Patient was prepared for discharge home on POD#1.  Consults: None  Treatments: surgery: total thyroidectomy  Discharge Exam: Blood pressure 88/60, pulse 74, temperature 98.5 F (36.9 C), temperature source Oral, resp. rate 16, height 5' 4.5" (1.638 m), weight 58.627 kg (129 lb 4 oz), last menstrual period 06/27/2015, SpO2 97 %. HEENT - clear Neck - wound dry and intact, minimal STS; voice normal Chest - clear bilaterally Cor - RRR  Disposition: Home  Discharge Instructions    Apply dressing    Complete by:  As directed   Apply light gauze dressing to wound before discharge home today.     Diet - low sodium heart healthy    Complete by:  As directed      Discharge instructions    Complete by:  As directed   [facility]  THYROID & PARATHYROID SURGERY:  POST-OP INSTRUCTIONS  Always review your discharge instruction sheet from the facility where your surgery was performed.  A prescription for pain medication may be given to you upon discharge.  Take your pain medication as prescribed.  If narcotic pain medicine is not needed, then you may take acetaminophen (Tylenol) or ibuprofen (Advil) as needed.  Take your usually prescribed medications unless otherwise directed.  If you need a refill on your pain medication, please contact your pharmacy. They will contact our  office to request authorization.  Prescriptions will not be processed by our office after 5 pm or on weekends.  Start with a light diet upon arrival home, such as soup and crackers or toast.  Be sure to drink plenty of fluids daily.  Resume your normal diet the day after surgery.  Most patients will experience some swelling and bruising on the chest and neck area.  Ice packs will help.  Swelling and bruising can take several days to resolve.   It is common to experience some constipation after surgery.  Increasing fluid intake and taking a stool softener will usually help or prevent this problem.  A mild laxative (Milk of Magnesia or Miralax) should be taken according to package directions if there has been no bowel movement after 48 hours.  You have steri-strips and a gauze dressing over your incision.  You may remove the gauze bandage on the second day after surgery, and you may shower at that time.  Leave your steri-strips (small skin tapes) in place directly over the incision.  These strips should remain on the skin for 5-7 days and then be removed.  You may get them wet in the shower and pat them dry.  You may resume regular (light) daily activities beginning the next day - such as daily self-care, walking, climbing stairs - gradually increasing activities as tolerated.  You may have sexual intercourse when it is comfortable.  Refrain from any heavy lifting or straining until approved by your doctor.  You may drive when you no longer are taking prescription pain medication, you can  comfortably wear a seatbelt, and you can safely maneuver your car and apply brakes.  You should see your doctor in the office for a follow-up appointment approximately two to three weeks after your surgery.  Make sure that you call for this appointment within a day or two after you arrive home to insure a convenient appointment time.  WHEN TO CALL YOUR DOCTOR: -- Fever greater than 101.5 -- Inability to urinate --  Nausea and/or vomiting - persistent -- Extreme swelling or bruising -- Continued bleeding from incision -- Increased pain, redness, or drainage from the incision -- Difficulty swallowing or breathing -- Muscle cramping or spasms -- Numbness or tingling in hands or around lips  The clinic staff is available to answer your questions during regular business hours.  Please don't hesitate to call and ask to speak to one of the nurses if you have concerns.  Earnstine Regal, MD, [facility] Office: [phone]  Website: [email or web address]     Increase activity slowly    Complete by:  As directed      Remove dressing in 24 hours    Complete by:  As directed             Medication List    TAKE these medications        acetaminophen 500 MG tablet  Commonly known as:  TYLENOL  Take 500-1,000 mg by mouth every 6 (six) hours as needed for mild pain.     calcium carbonate 1250 (500 CA) MG tablet  Commonly known as:  OS-CAL - dosed in mg of elemental calcium  Take 2 tablets (1,000 mg of elemental calcium total) by mouth 2 (two) times daily with a meal.     cholecalciferol 400 UNITS Tabs tablet  Commonly known as:  VITAMIN D  Take 400 Units by mouth daily.     fluticasone 50 MCG/ACT nasal spray  Commonly known as:  FLONASE  Place 1 spray into both nostrils daily.     HYDROcodone-acetaminophen 5-325 MG tablet  Commonly known as:  NORCO/VICODIN  Take 1-2 tablets by mouth every 4 (four) hours as needed for moderate pain.     ibuprofen 200 MG tablet  Commonly known as:  ADVIL,MOTRIN  Take 200-800 mg by mouth every 6 (six) hours as needed for moderate pain.     loratadine 10 MG tablet  Commonly known as:  CLARITIN  Take 10 mg by mouth daily.     SYNTHROID 100 MCG tablet  Generic drug:  levothyroxine  Take 1 tablet (100 mcg total) by mouth daily.     vitamin E 400 UNIT capsule  Take 400 Units by mouth daily.            Follow-up Information    Follow up with Earnstine Regal, MD. Schedule an appointment as soon as possible for a visit in 3 weeks.   Specialty:  General Surgery   Why:  For wound re-check   Contact information:   [place] [address] [phone]       Earnstine Regal, MD, [facility] Office: [phone]   Signed: Earnstine Regal 07/01/2015, 7:35 AM

## 2015-07-04 DIAGNOSIS — C73 Malignant neoplasm of thyroid gland: Secondary | ICD-10-CM | POA: Insufficient documentation

## 2015-07-12 ENCOUNTER — Other Ambulatory Visit: Payer: Self-pay | Admitting: Endocrinology

## 2015-07-12 DIAGNOSIS — C73 Malignant neoplasm of thyroid gland: Secondary | ICD-10-CM

## 2015-07-19 ENCOUNTER — Other Ambulatory Visit: Payer: Self-pay | Admitting: Obstetrics and Gynecology

## 2015-07-19 DIAGNOSIS — R2232 Localized swelling, mass and lump, left upper limb: Secondary | ICD-10-CM

## 2015-07-21 ENCOUNTER — Other Ambulatory Visit: Payer: Self-pay | Admitting: Obstetrics and Gynecology

## 2015-07-21 DIAGNOSIS — R2232 Localized swelling, mass and lump, left upper limb: Secondary | ICD-10-CM

## 2015-07-26 ENCOUNTER — Other Ambulatory Visit: Payer: BLUE CROSS/BLUE SHIELD

## 2015-07-27 ENCOUNTER — Other Ambulatory Visit: Payer: Self-pay | Admitting: Obstetrics and Gynecology

## 2015-07-27 ENCOUNTER — Ambulatory Visit
Admission: RE | Admit: 2015-07-27 | Discharge: 2015-07-27 | Disposition: A | Discharge status: Home / Self Care | Payer: BLUE CROSS/BLUE SHIELD | Source: Ambulatory Visit | Attending: Obstetrics and Gynecology | Admitting: Obstetrics and Gynecology

## 2015-07-27 ENCOUNTER — Encounter (HOSPITAL_COMMUNITY)
Admission: RE | Admit: 2015-07-27 | Discharge: 2015-07-27 | Disposition: A | Discharge status: Home / Self Care | Payer: BLUE CROSS/BLUE SHIELD | Source: Ambulatory Visit | Attending: Endocrinology | Admitting: Endocrinology

## 2015-07-27 DIAGNOSIS — C73 Malignant neoplasm of thyroid gland: Secondary | ICD-10-CM | POA: Diagnosis present

## 2015-07-27 DIAGNOSIS — R2232 Localized swelling, mass and lump, left upper limb: Secondary | ICD-10-CM

## 2015-07-27 IMAGING — US US EXTREM UP LEFT LTD
1 series · 6 of 6 positions shown · non-contrast
Comparison: Previous exam(s).

CLINICAL DATA: Left axillary area of palpable concern. Recent
diagnosis of thyroid cancer.

EXAM:
DIGITAL DIAGNOSTIC BILATERAL MAMMOGRAM WITH 3D TOMOSYNTHESIS WITH
CAD
ULTRASOUND LEFT AXILLA

[Series 1: us extrem up left ltd · 0.05mm/px · 6 of 6 slices shown]
[im 1/6]
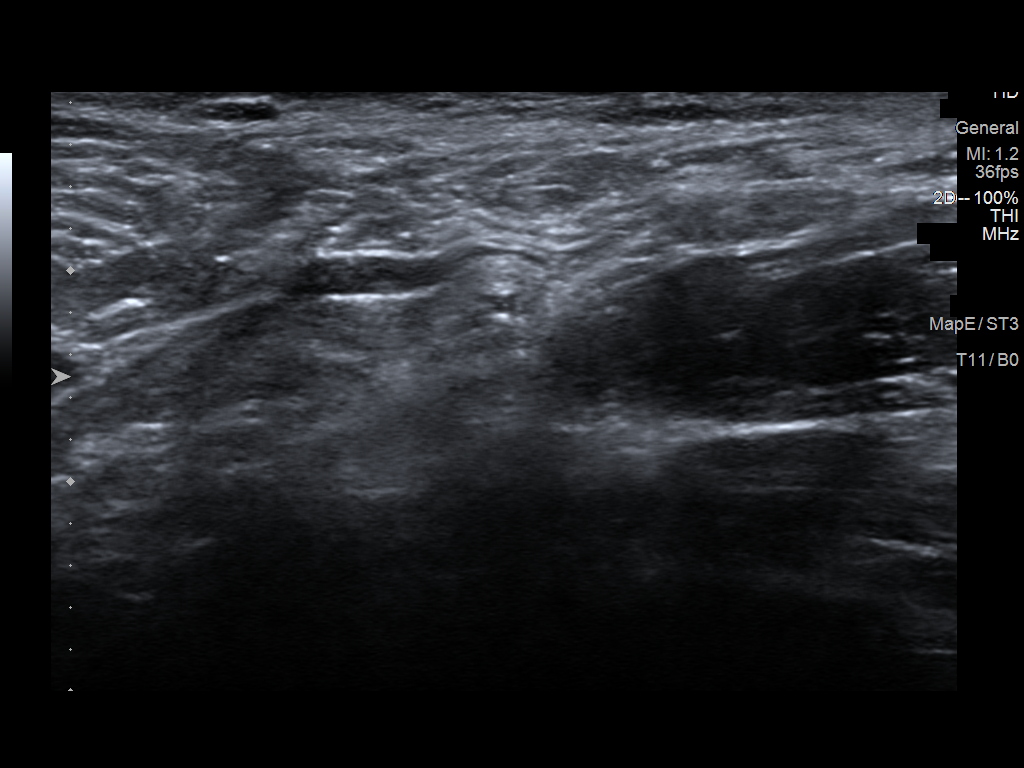
[im 2/6]
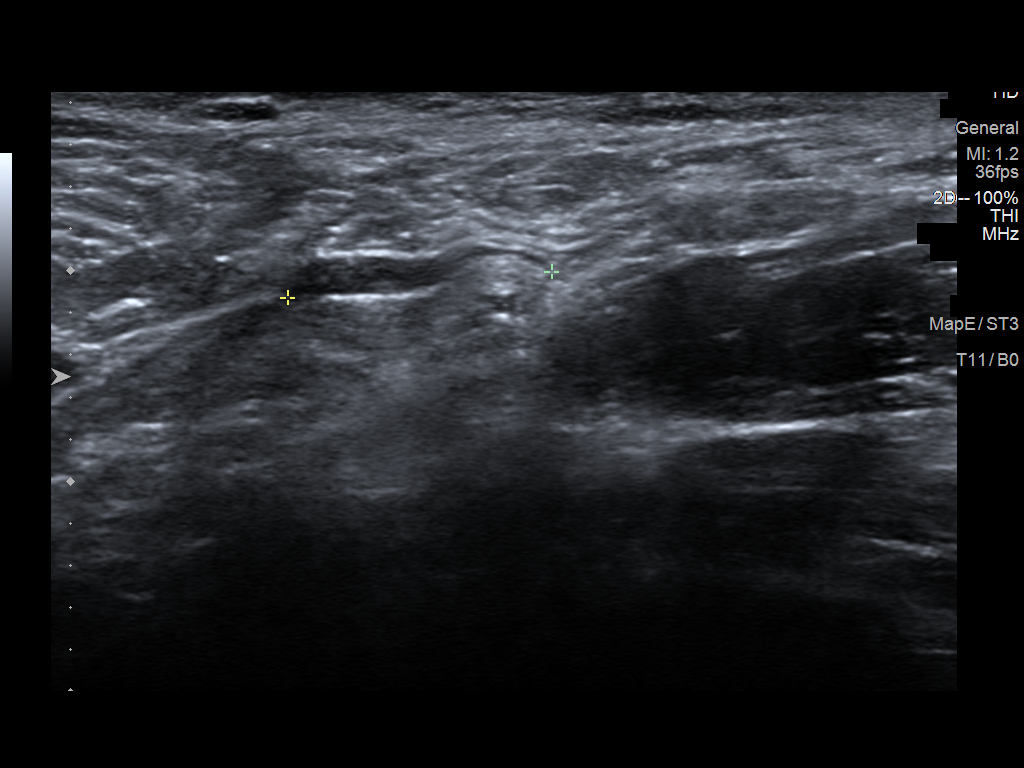
[im 3/6]
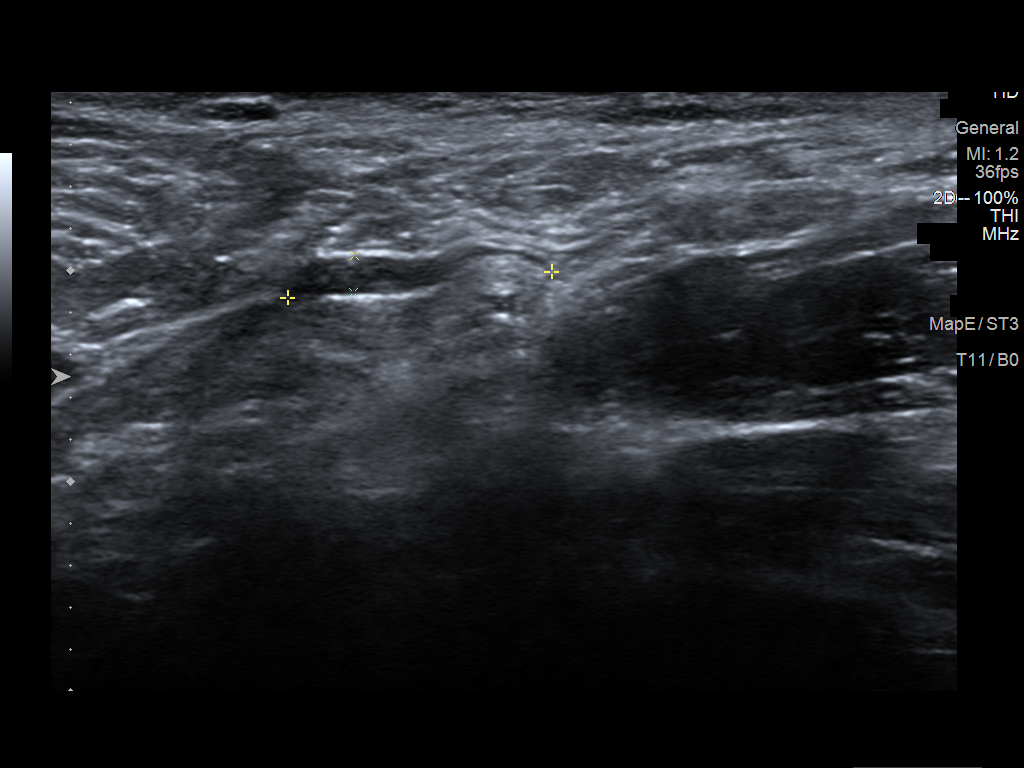
[im 4/6]
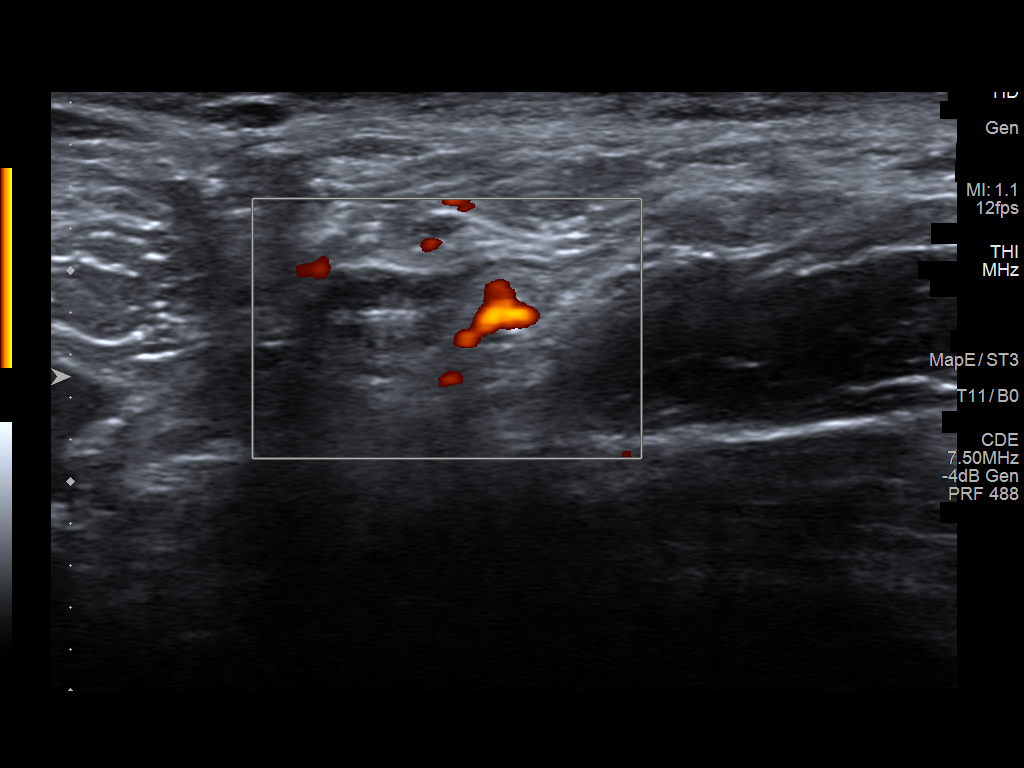
[im 5/6]
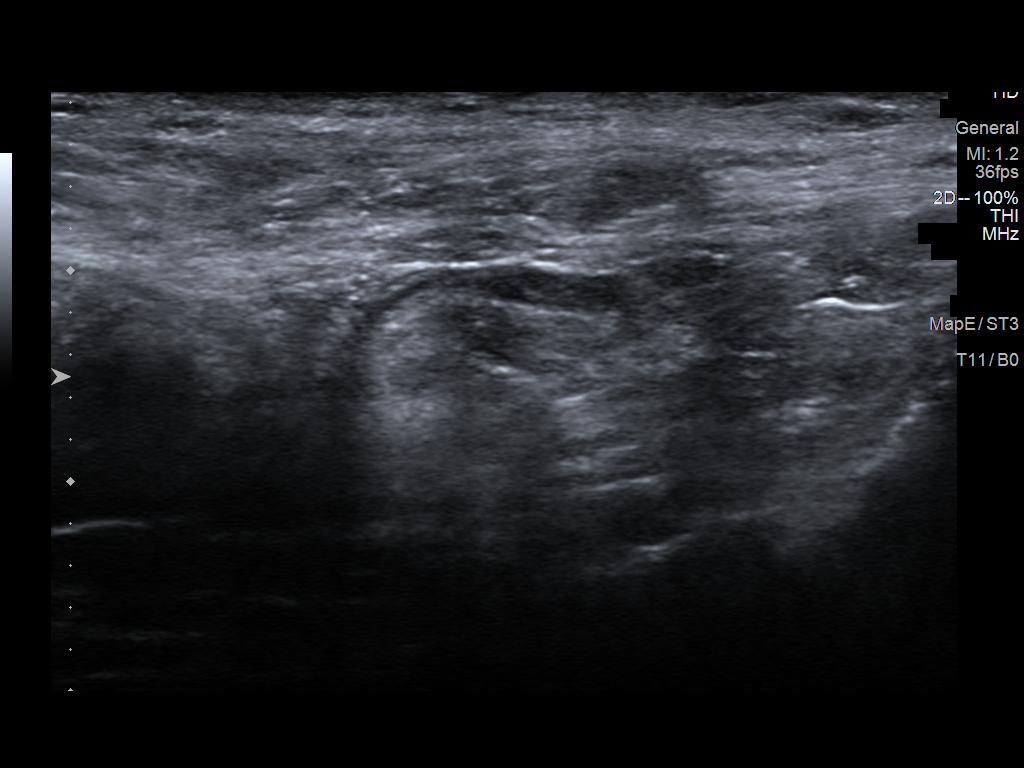
[im 6/6]
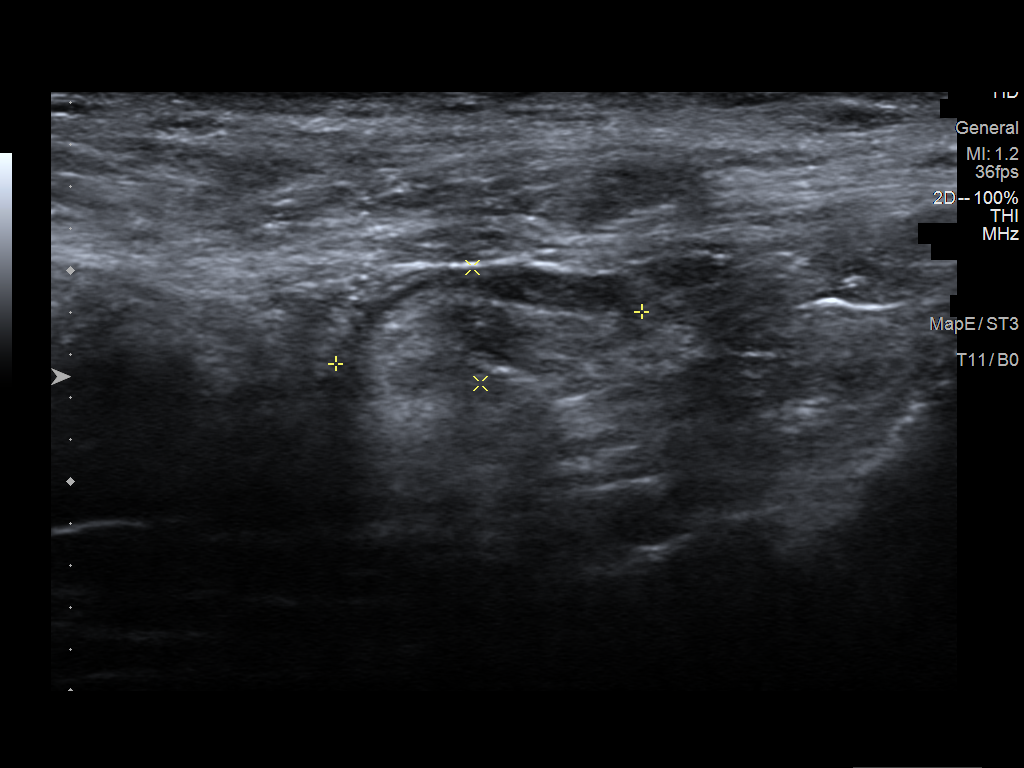

[6 of 6 positions shown; findings below may reference images not displayed]

ACR Breast Density Category c: The breast tissue is heterogeneously
dense, which may obscure small masses.
FINDINGS: Mammographically, there are no suspicious masses, areas of
architectural distortion or microcalcifications in either breast.

Mammographic images were processed with CAD.

On physical exam, no suspicious masses are found.

Targeted ultrasound is performed, showing no suspicious masses or
shadowing lesions. Normal appearing left axillary lymph nodes are
noted.
IMPRESSION: No mammographic evidence of malignancy in either breast.

No evidence of left axillary lymphadenopathy sonographically.

RECOMMENDATION:
Screening mammogram in one year.(Code:[B0])

I have discussed the findings and recommendations with the patient.
Results were also provided in writing at the conclusion of the
visit. If applicable, a reminder letter will be sent to the patient
regarding the next appointment.

BI-RADS CATEGORY  2: Benign.

## 2015-07-27 IMAGING — MG MM DIAG BREAST TOMO BILATERAL
6 of 10 series · 6 of 30 positions shown · non-contrast
Comparison: Previous exam(s).

CLINICAL DATA: Left axillary area of palpable concern. Recent
diagnosis of thyroid cancer.

EXAM:
DIGITAL DIAGNOSTIC BILATERAL MAMMOGRAM WITH 3D TOMOSYNTHESIS WITH
CAD
ULTRASOUND LEFT AXILLA

[L CC]
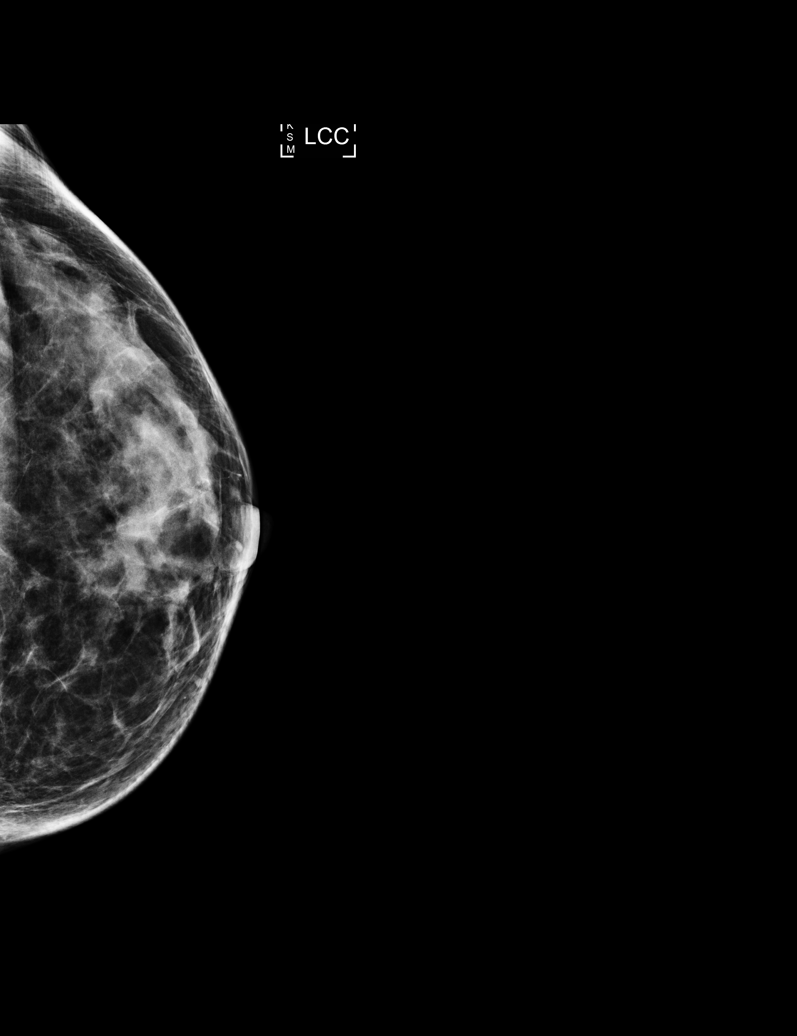

[L MLO (1 of 2)]
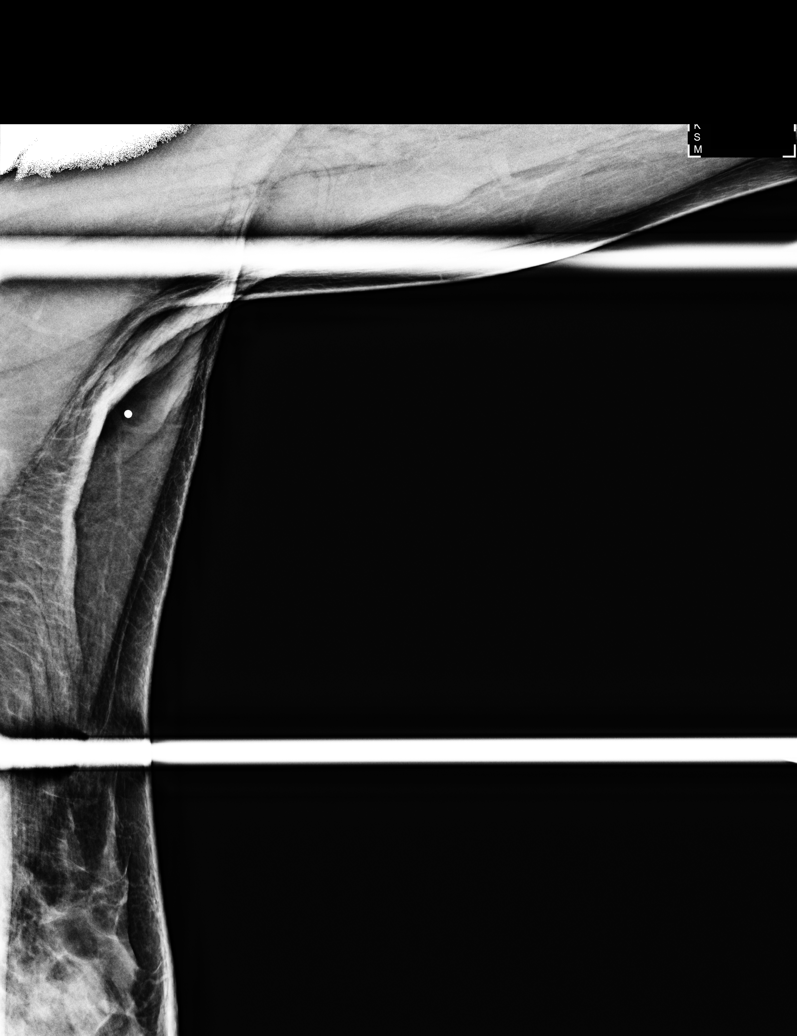

[R CC]
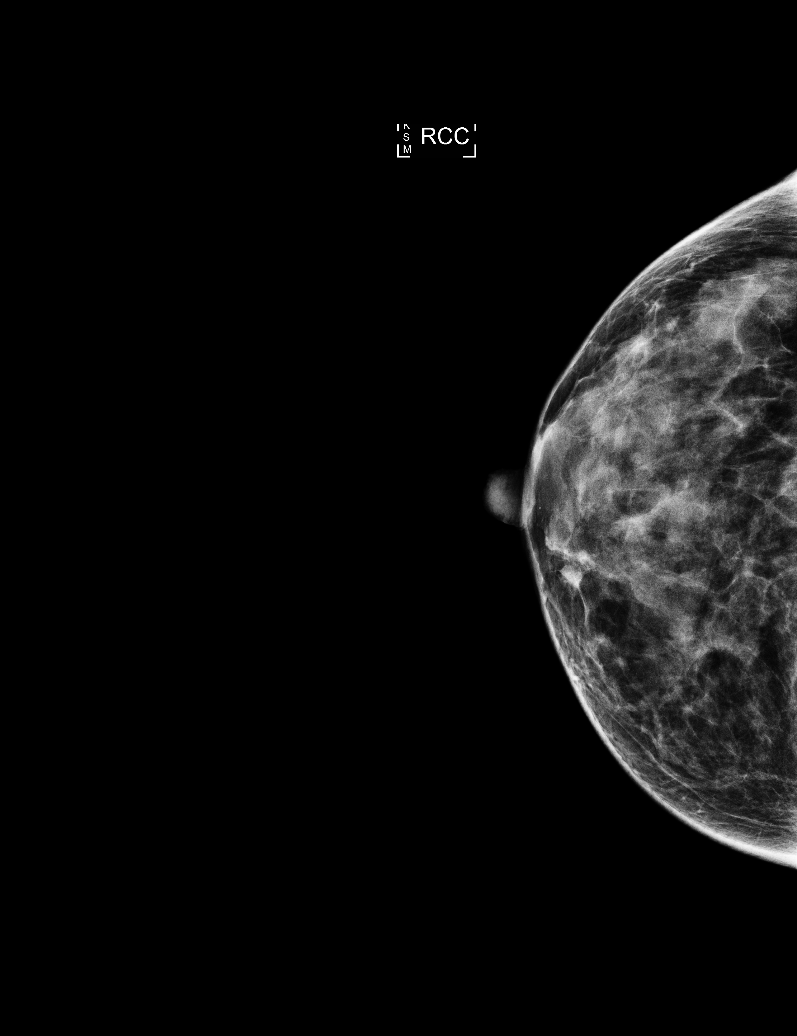

[R MLO]
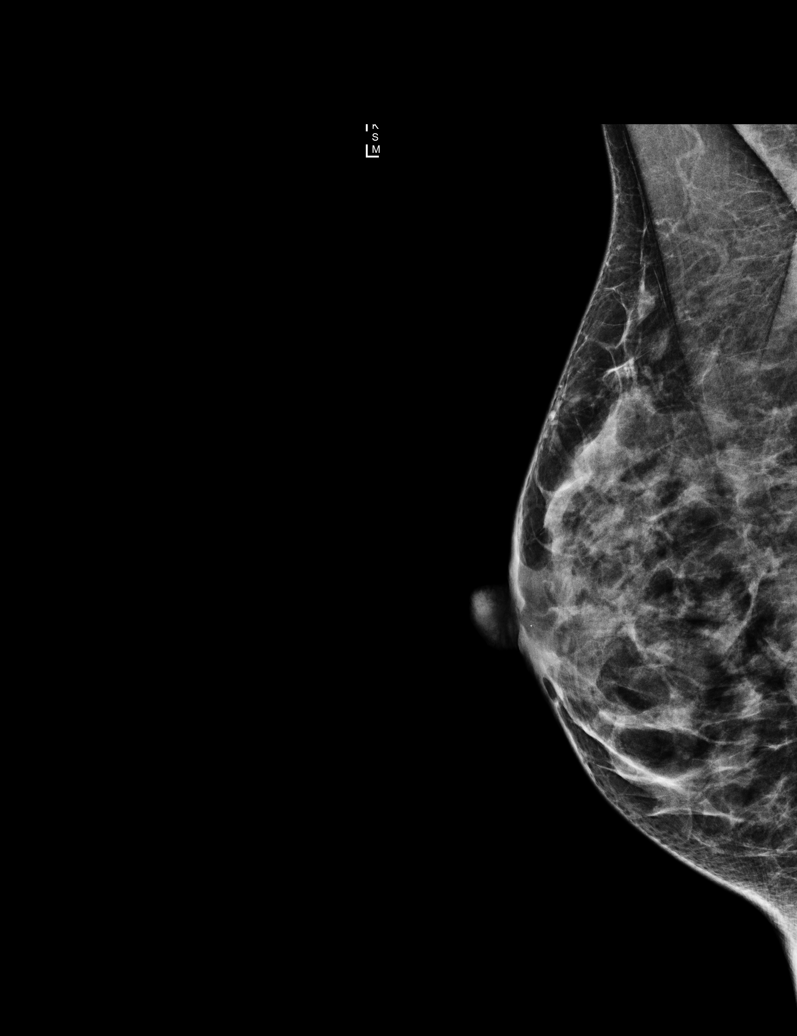

[L MLO (2 of 2)]
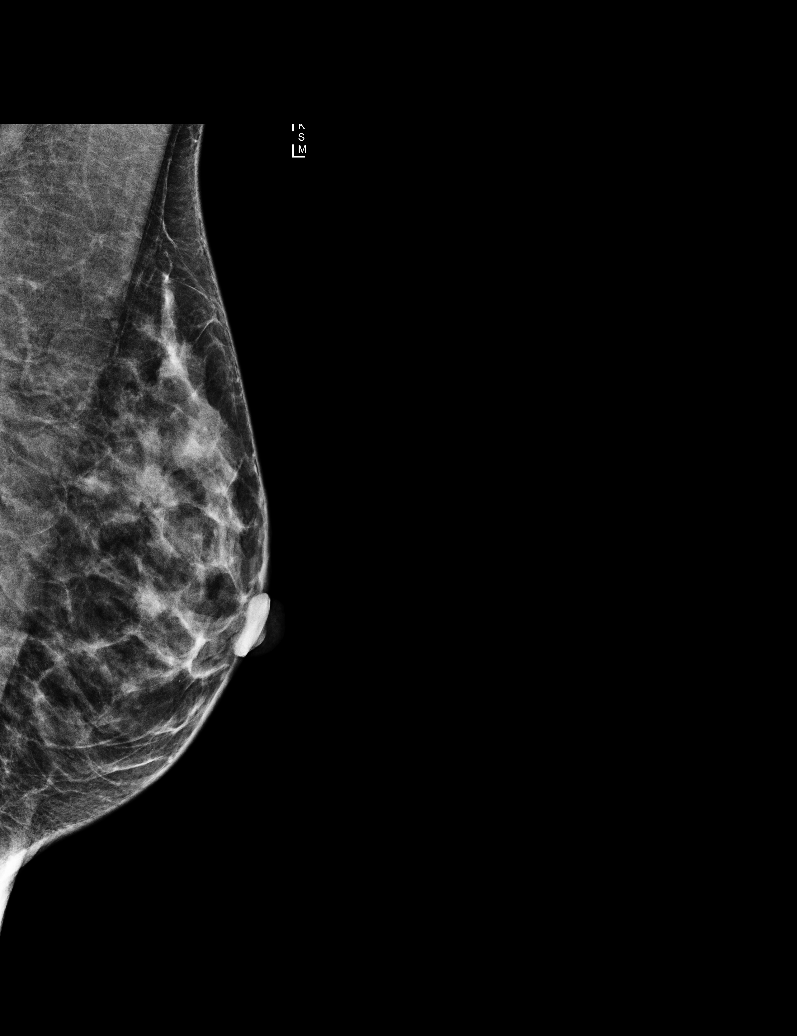

[R CC tomo · tomo slice 20/39.0]
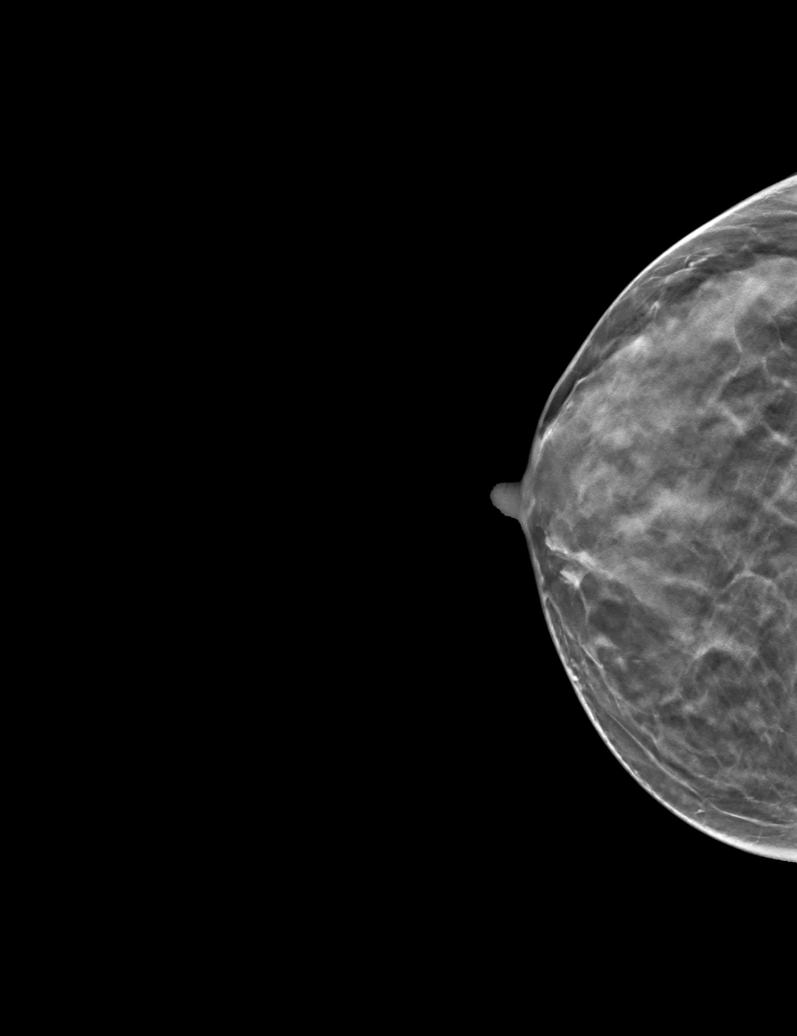

[6 of 30 positions shown; findings below may reference images not displayed]

ACR Breast Density Category c: The breast tissue is heterogeneously
dense, which may obscure small masses.
FINDINGS: Mammographically, there are no suspicious masses, areas of
architectural distortion or microcalcifications in either breast.

Mammographic images were processed with CAD.

On physical exam, no suspicious masses are found.

Targeted ultrasound is performed, showing no suspicious masses or
shadowing lesions. Normal appearing left axillary lymph nodes are
noted.
IMPRESSION: No mammographic evidence of malignancy in either breast.

No evidence of left axillary lymphadenopathy sonographically.

RECOMMENDATION:
Screening mammogram in one year.(Code:[B0])

I have discussed the findings and recommendations with the patient.
Results were also provided in writing at the conclusion of the
visit. If applicable, a reminder letter will be sent to the patient
regarding the next appointment.

BI-RADS CATEGORY  2: Benign.

## 2015-07-27 MED ORDER — THYROTROPIN ALFA 1.1 MG IM SOLR
0.9000 mg | INTRAMUSCULAR | Status: AC
  Administered 2015-07-27: 0.9 mg via INTRAMUSCULAR

## 2015-07-27 MED ORDER — STERILE WATER FOR INJECTION IJ SOLN
INTRAMUSCULAR | Status: AC
  Filled 2015-07-27: qty 10

## 2015-07-28 ENCOUNTER — Encounter (HOSPITAL_COMMUNITY)
Admission: RE | Admit: 2015-07-28 | Discharge: 2015-07-28 | Disposition: A | Discharge status: Home / Self Care | Payer: BLUE CROSS/BLUE SHIELD | Source: Ambulatory Visit | Attending: Endocrinology | Admitting: Endocrinology

## 2015-07-28 DIAGNOSIS — C73 Malignant neoplasm of thyroid gland: Secondary | ICD-10-CM | POA: Diagnosis not present

## 2015-07-28 MED ORDER — THYROTROPIN ALFA 1.1 MG IM SOLR
0.9000 mg | INTRAMUSCULAR | Status: AC
  Administered 2015-07-28: 0.9 mg via INTRAMUSCULAR

## 2015-07-28 MED ORDER — STERILE WATER FOR INJECTION IJ SOLN
INTRAMUSCULAR | Status: AC
  Filled 2015-07-28: qty 10

## 2015-07-29 ENCOUNTER — Other Ambulatory Visit: Payer: BLUE CROSS/BLUE SHIELD

## 2015-07-29 ENCOUNTER — Encounter (HOSPITAL_COMMUNITY)
Admission: RE | Admit: 2015-07-29 | Discharge: 2015-07-29 | Disposition: A | Discharge status: Home / Self Care | Payer: BLUE CROSS/BLUE SHIELD | Source: Ambulatory Visit | Attending: Endocrinology | Admitting: Endocrinology

## 2015-07-29 DIAGNOSIS — C73 Malignant neoplasm of thyroid gland: Secondary | ICD-10-CM | POA: Diagnosis not present

## 2015-07-29 LAB — HCG, SERUM, QUALITATIVE: Preg, Serum: NEGATIVE

## 2015-07-29 MED ORDER — SODIUM IODIDE I 131 CAPSULE
99.4000 | Freq: Once | INTRAVENOUS | Status: AC | PRN
  Administered 2015-07-29: 99.4 via ORAL

## 2015-08-05 ENCOUNTER — Encounter (HOSPITAL_COMMUNITY)
Admission: RE | Admit: 2015-08-05 | Discharge: 2015-08-05 | Disposition: A | Discharge status: Home / Self Care | Payer: BLUE CROSS/BLUE SHIELD | Source: Ambulatory Visit | Attending: Endocrinology | Admitting: Endocrinology

## 2015-08-05 DIAGNOSIS — C73 Malignant neoplasm of thyroid gland: Secondary | ICD-10-CM | POA: Diagnosis not present

## 2015-08-05 IMAGING — NM NM [ID] THYROID CANCER METS SP CA TX
6 series · 6 of 6 positions shown · non-contrast
Comparison: None.

CLINICAL DATA: Follicular thyroid carcinoma. 2.8 cm lesion with
lymphovascular invasion and focal capsular invasion. Staging T2 Nx.
Patient status post remnant ablation and adjuvant therapy.

EXAM:
NUCLEAR MEDICINE [7F] POST THERAPY WHOLE BODY SCAN
TECHNIQUE: The patient received 99.4 mCi [7F] sodium iodide for the treatment
of thyroid cancer within the past 10 days. The patient returns
today, and whole body scanning was performed in the anterior and
posterior projections.

[Series 1: marker · 4.14mm/px · 1 of 1 slices shown (1 of 2)]
[im 1/1]
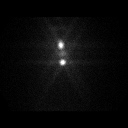

[Series 1: marker · 4.14mm/px · 1 of 1 slices shown (2 of 2)]
[im 1/1]
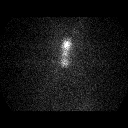

[Series 2: static thyroid no marker · 4.14mm/px · 1 of 1 slices shown (1 of 2)]
[im 1/1]
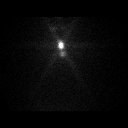

[Series 2: static thyroid no marker · 4.14mm/px · 1 of 1 slices shown (2 of 2)]
[im 1/1]
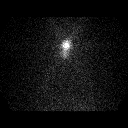

[Series 3: i131 whole body · 2.66mm/px · 1 of 1 slices shown (1 of 2)]
[im 1/1]
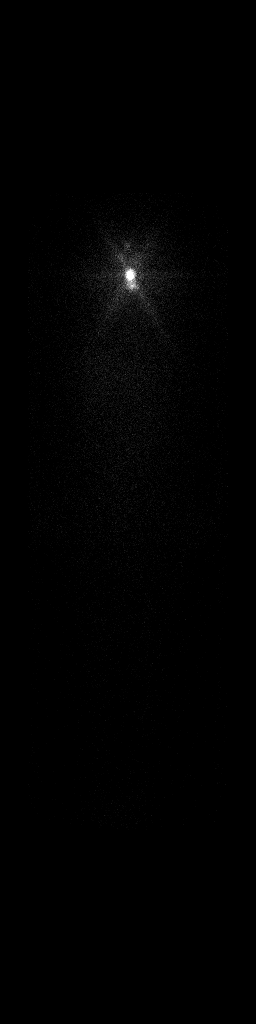

[Series 3: i131 whole body · 2.66mm/px · 1 of 1 slices shown (2 of 2)]
[im 1/1  full-range]
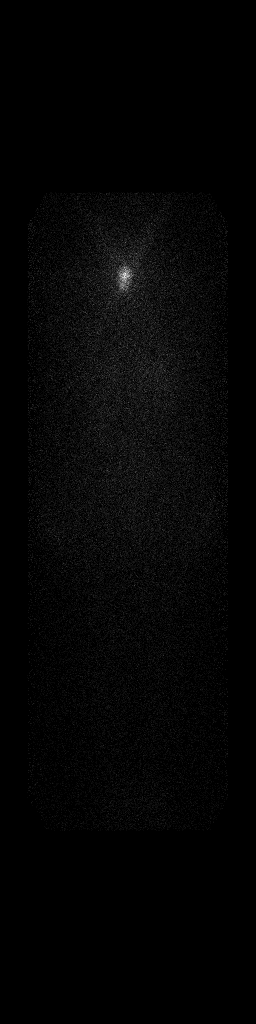

[6 of 6 positions shown; findings below may reference images not displayed]

FINDINGS: Two foci of uptake centrally within thyroid bed likely represents
remnant thyroid activity. No evidence of metastatic disease outside
of thyroid bed.
IMPRESSION: 1. Remnant activity within the thyroid bed.
2. No evidence distant metastasis.

## 2016-06-01 ENCOUNTER — Ambulatory Visit (INDEPENDENT_AMBULATORY_CARE_PROVIDER_SITE_OTHER): Payer: Self-pay | Admitting: Family Medicine

## 2016-06-01 ENCOUNTER — Encounter: Payer: Self-pay | Admitting: Family Medicine

## 2016-06-01 VITALS — BP 112/71 | HR 76 | Ht 65.0 in | Wt 124.9 lb

## 2016-06-01 DIAGNOSIS — Z85828 Personal history of other malignant neoplasm of skin: Secondary | ICD-10-CM | POA: Insufficient documentation

## 2016-06-01 DIAGNOSIS — Z8249 Family history of ischemic heart disease and other diseases of the circulatory system: Secondary | ICD-10-CM

## 2016-06-01 DIAGNOSIS — J3089 Other allergic rhinitis: Secondary | ICD-10-CM | POA: Insufficient documentation

## 2016-06-01 DIAGNOSIS — C73 Malignant neoplasm of thyroid gland: Secondary | ICD-10-CM

## 2016-06-01 DIAGNOSIS — M159 Polyosteoarthritis, unspecified: Secondary | ICD-10-CM

## 2016-06-01 DIAGNOSIS — E559 Vitamin D deficiency, unspecified: Secondary | ICD-10-CM

## 2016-06-01 NOTE — Assessment & Plan Note (Deleted)
Sx- oct 2016. Follicular CA.    RADIOACTIVE I TXMNT.   On SYNTRHOID.   sURVEILLICE- 2-3 [place]
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
## 2016-06-01 NOTE — Patient Instructions (Addendum)
Please remember to get me her recent labs that were done at an outside facility with her other physician.  Otherwise follow-up in 6 months or as needed.       Heart-Healthy Eating Plan Many factors influence your heart health, including eating and exercise habits. Heart (coronary) risk increases with abnormal blood fat (lipid) levels. Heart-healthy meal planning includes limiting unhealthy fats, increasing healthy fats, and making other small dietary changes. This includes maintaining a healthy body weight to help keep lipid levels within a normal range.   WHAT TYPES OF FAT SHOULD I CHOOSE?  Choose healthy fats more often. Choose monounsaturated and polyunsaturated fats, such as olive oil and canola oil, flaxseeds, walnuts, almonds, and seeds.  Eat more omega-3 fats. Good choices include salmon, mackerel, sardines, tuna, flaxseed oil, and ground flaxseeds. Aim to eat fish at least two times each week.  Limit saturated fats. Saturated fats are primarily found in animal products, such as meats, butter, and cream. Plant sources of saturated fats include palm oil, palm kernel oil, and coconut oil.  Avoid foods with partially hydrogenated oils in them. These contain trans fats. Examples of foods that contain trans fats are stick margarine, some tub margarines, cookies, crackers, and other baked goods. WHAT GENERAL GUIDELINES DO I NEED TO FOLLOW?  Check food labels carefully to identify foods with trans fats or high amounts of saturated fat.  Fill one half of your plate with vegetables and green salads. Eat 4-5 servings of vegetables per day. A serving of vegetables equals 1 cup of raw leafy vegetables,  cup of raw or cooked cut-up vegetables, or  cup of vegetable juice.  Fill one fourth of your plate with whole grains. Look for the word "whole" as the first word in the ingredient list.  Fill one fourth of your plate with lean protein foods.  Eat 4-5 servings of fruit per day. A serving  of fruit equals one medium whole fruit,  cup of dried fruit,  cup of fresh, frozen, or canned fruit, or  cup of 100% fruit juice.  Eat more foods that contain soluble fiber. Examples of foods that contain this type of fiber are apples, broccoli, carrots, beans, peas, and barley. Aim to get 20-30 g of fiber per day.  Eat more home-cooked food and less restaurant, buffet, and fast food.  Limit or avoid alcohol.  Limit foods that are high in starch and sugar.  Avoid fried foods.  Cook foods by using methods other than frying. Baking, boiling, grilling, and broiling are all great options. Other fat-reducing suggestions include:  Removing the skin from poultry.  Removing all visible fats from meats.  Skimming the fat off of stews, soups, and gravies before serving them.  Steaming vegetables in water or broth.  Lose weight if you are overweight. Losing just 5-10% of your initial body weight can help your overall health and prevent diseases such as diabetes and heart disease.  Increase your consumption of nuts, legumes, and seeds to 4-5 servings per week. One serving of dried beans or legumes equals  cup after being cooked, one serving of nuts equals 1 ounces, and one serving of seeds equals  ounce or 1 tablespoon.  You may need to monitor your salt (sodium) intake, especially if you have high blood pressure. Talk with your health care provider or dietitian to get more information about reducing sodium. WHAT FOODS CAN I EAT? Grains Breads, including [place], white, pita, wheat, raisin, rye, oatmeal, and [place]. Tortillas that are  neither fried nor made with lard or trans fat. Low-fat rolls, including hotdog and hamburger buns and English muffins. Biscuits. Muffins. Waffles. Pancakes. Light popcorn. Whole-grain cereals. Flatbread. Melba toast. Pretzels. Breadsticks. Rusks. Low-fat snacks and crackers, including oyster, saltine, matzo, graham, animal, and rye. Rice and pasta, including  brown rice and those that are made with whole wheat. Vegetables All vegetables. Fruits All fruits, but limit coconut. Meats and Other Protein Sources Lean, well-trimmed beef, veal, pork, and lamb. Chicken and [place] without skin. All fish and shellfish. Wild duck, rabbit, pheasant, and venison. Egg whites or low-cholesterol egg substitutes. Dried beans, peas, lentils, and tofu.Seeds and most nuts. Dairy Low-fat or nonfat cheeses, including ricotta, string, and mozzarella. Skim or 1% milk that is liquid, powdered, or evaporated. Buttermilk that is made with low-fat milk. Nonfat or low-fat yogurt. Beverages Mineral water. Diet carbonated beverages. Sweets and Desserts Sherbets and fruit ices. Honey, jam, marmalade, jelly, and syrups. Meringues and gelatins. Pure sugar candy, such as hard candy, jelly beans, gumdrops, mints, marshmallows, and small amounts of dark chocolate. [employer] Eat all sweets and desserts in moderation. Fats and Oils Nonhydrogenated (trans-free) margarines. Vegetable oils, including soybean, sesame, sunflower, olive, peanut, safflower, corn, canola, and cottonseed. Salad dressings or mayonnaise that are made with a vegetable oil. Limit added fats and oils that you use for cooking, baking, salads, and as spreads. Other Cocoa powder. Coffee and tea. All seasonings and condiments. The items listed above may not be a complete list of recommended foods or beverages. Contact your dietitian for more options. WHAT FOODS ARE NOT RECOMMENDED? Grains Breads that are made with saturated or trans fats, oils, or whole milk. Croissants. Butter rolls. Cheese breads. Sweet rolls. Donuts. Buttered popcorn. Chow mein noodles. High-fat crackers, such as cheese or butter crackers. Meats and Other Protein Sources Fatty meats, such as hotdogs, short ribs, sausage, spareribs, bacon, ribeye roast or steak, and mutton. High-fat deli meats, such as salami and bologna. Caviar. Domestic duck  and goose. Organ meats, such as kidney, liver, sweetbreads, brains, gizzard, chitterlings, and heart. Dairy Cream, sour cream, cream cheese, and creamed cottage cheese. Whole milk cheeses, including blue (bleu), Monterey Jack, [place], [place], American, [place], Swiss, [place], [place], and [place]. Whole or 2% milk that is liquid, evaporated, or condensed. Whole buttermilk. Cream sauce or high-fat cheese sauce. Yogurt that is made from whole milk. Beverages Regular sodas and drinks with added sugar. Sweets and Desserts Frosting. Pudding. Cookies. Cakes other than angel food cake. Candy that has milk chocolate or white chocolate, hydrogenated fat, butter, coconut, or unknown ingredients. Buttered syrups. Full-fat ice cream or ice cream drinks. Fats and Oils Gravy that has suet, meat fat, or shortening. Cocoa butter, hydrogenated oils, palm oil, coconut oil, palm kernel oil. These can often be found in baked products, candy, fried foods, nondairy creamers, and whipped toppings. Solid fats and shortenings, including bacon fat, salt pork, lard, and butter. Nondairy cream substitutes, such as coffee creamers and sour cream substitutes. Salad dressings that are made of unknown oils, cheese, or sour cream. The items listed above may not be a complete list of foods and beverages to avoid. Contact your dietitian for more information.   This information is not intended to replace advice given to you by your health care provider. Make sure you discuss any questions you have with your health care provider.   Document Released: 06/19/2008 Document Revised: 10/01/2014 Document Reviewed: 03/04/2014 Elsevier Interactive Patient Education [employer].  Mediterranean Diet  Why follow it? Research shows. . Those who follow the Mediterranean diet have a reduced risk of heart disease  . The diet is associated with a reduced incidence of Parkinson's and Alzheimer's diseases . People  following the diet may have longer life expectancies and lower rates of chronic diseases  . The Dietary Guidelines for Americans recommends the Mediterranean diet as an eating plan to promote health and prevent disease  What Is the Mediterranean Diet?  . Healthy eating plan based on typical foods and recipes of Mediterranean-style cooking . The diet is primarily a plant based diet; these foods should make up a majority of meals   Starches - Plant based foods should make up a majority of meals - They are an important sources of vitamins, minerals, energy, antioxidants, and fiber - Choose whole grains, foods high in fiber and minimally processed items  - Typical grain sources include wheat, oats, barley, corn, brown rice, bulgar, farro, millet, polenta, couscous  - Various types of beans include chickpeas, lentils, fava beans, black beans, white beans   Fruits  Veggies - Large quantities of antioxidant rich fruits & veggies; 6 or more servings  - Vegetables can be eaten raw or lightly drizzled with oil and cooked  - Vegetables common to the traditional Mediterranean Diet include: artichokes, arugula, beets, broccoli, brussel sprouts, cabbage, carrots, celery, collard greens, cucumbers, eggplant, kale, leeks, lemons, lettuce, mushrooms, okra, onions, peas, peppers, potatoes, pumpkin, radishes, rutabaga, shallots, spinach, sweet potatoes, turnips, zucchini - Fruits common to the Mediterranean Diet include: apples, apricots, avocados, cherries, clementines, dates, figs, grapefruits, grapes, melons, nectarines, oranges, peaches, pears, pomegranates, strawberries, tangerines  Fats - Replace butter and margarine with healthy oils, such as olive oil, canola oil, and tahini  - Limit nuts to no more than a handful a day  - Nuts include walnuts, almonds, pecans, pistachios, pine nuts  - Limit or avoid candied, honey roasted or heavily salted nuts - Olives are central to the [employer] - can be eaten  whole or used in a variety of dishes   Meats Protein - Limiting red meat: no more than a few times a month - When eating red meat: choose lean cuts and keep the portion to the size of deck of cards - Eggs: approx. 0 to 4 times a week  - Fish and lean poultry: at least 2 a week  - Healthy protein sources include, chicken, [place], lean beef, lamb - Increase intake of seafood such as tuna, salmon, trout, mackerel, shrimp, scallops - Avoid or limit high fat processed meats such as sausage and bacon  Dairy - Include moderate amounts of low fat dairy products  - Focus on healthy dairy such as fat free yogurt, skim milk, low or reduced fat cheese - Limit dairy products higher in fat such as whole or 2% milk, cheese, ice cream  Alcohol - Moderate amounts of red wine is ok  - No more than 5 oz daily for women (all ages) and men older than age 27  - No more than 10 oz of wine daily for men younger than 18  Other - Limit sweets and other desserts  - Use herbs and spices instead of salt to flavor foods  - Herbs and spices common to the traditional Mediterranean Diet include: basil, bay leaves, chives, cloves, cumin, fennel, garlic, lavender, marjoram, mint, oregano, parsley, pepper, rosemary, sage, savory, sumac, tarragon, thyme   It's not just a diet, it's a lifestyle:  .  The Mediterranean diet includes lifestyle factors typical of those in the region  . Foods, drinks and meals are best eaten with others and savored . Daily physical activity is important for overall good health . This could be strenuous exercise like running and aerobics . This could also be more leisurely activities such as walking, housework, yard-work, or taking the stairs . Moderation is the key; a balanced and healthy diet accommodates most foods and drinks . Consider portion sizes and frequency of consumption of certain foods   Meal Ideas & Options:  . Breakfast:  o Whole wheat toast or whole wheat English muffins with peanut  butter & hard boiled egg o Steel cut oats topped with apples & cinnamon and skim milk  o Fresh fruit: banana, strawberries, melon, berries, peaches  o Smoothies: strawberries, bananas, greek yogurt, peanut butter o Low fat greek yogurt with blueberries and granola  o Egg white omelet with spinach and mushrooms o Breakfast couscous: whole wheat couscous, apricots, skim milk, cranberries  . Sandwiches:  o Hummus and grilled vegetables (peppers, zucchini, squash) on whole wheat bread   o Grilled chicken on whole wheat pita with lettuce, tomatoes, cucumbers or tzatziki  o Tuna salad on whole wheat bread: tuna salad made with greek yogurt, olives, red peppers, capers, green onions o Garlic rosemary lamb pita: lamb sauted with garlic, rosemary, salt & pepper; add lettuce, cucumber, greek yogurt to pita - flavor with lemon juice and black pepper  . Seafood:  o Mediterranean grilled salmon, seasoned with garlic, basil, parsley, lemon juice and black pepper o Shrimp, lemon, and spinach whole-grain pasta salad made with low fat greek yogurt  o Seared scallops with lemon orzo  o Seared tuna steaks seasoned salt, pepper, coriander topped with tomato mixture of olives, tomatoes, olive oil, minced garlic, parsley, green onions and cappers  . Meats:  o Herbed greek chicken salad with kalamata olives, cucumber, feta  o Red bell peppers stuffed with spinach, bulgur, lean ground beef (or lentils) & topped with feta   o Kebabs: skewers of chicken, tomatoes, onions, zucchini, squash  o [place] burgers: made with red onions, mint, dill, lemon juice, feta cheese topped with roasted red peppers . Vegetarian o Cucumber salad: cucumbers, artichoke hearts, celery, red onion, feta cheese, tossed in olive oil & lemon juice  o Hummus and whole grain pita points with a greek salad (lettuce, tomato, feta, olives, cucumbers, red onion) o Lentil soup with celery, carrots made with vegetable broth, garlic, salt and pepper   o Tabouli salad: parsley, bulgur, mint, scallions, cucumbers, tomato, radishes, lemon juice, olive oil, salt and pepper.

## 2016-06-01 NOTE — Assessment & Plan Note (Signed)
OTC PRN-

## 2016-10-12 ENCOUNTER — Other Ambulatory Visit: Payer: Self-pay | Admitting: Obstetrics and Gynecology

## 2016-10-15 LAB — CYTOLOGY - PAP

## 2020-12-07 ENCOUNTER — Other Ambulatory Visit: Payer: Self-pay | Admitting: Endocrinology

## 2020-12-08 ENCOUNTER — Other Ambulatory Visit: Payer: Self-pay | Admitting: Endocrinology

## 2020-12-08 DIAGNOSIS — C73 Malignant neoplasm of thyroid gland: Secondary | ICD-10-CM

## 2020-12-29 ENCOUNTER — Other Ambulatory Visit: Payer: Self-pay

## 2021-12-12 ENCOUNTER — Telehealth: Payer: Self-pay | Admitting: Surgery

## 2021-12-12 NOTE — Telephone Encounter (Signed)
Telephone call to patient returning her request for a discussion of her clinical history and recommendations from [facility] endocrinology. ? ?We had a lengthy discussion regarding her surgical history and options for management going forward.  I supported moving forward with further evaluation at a tertiary care cancer center such as [facility] in [place], or [facility]. ? ?Patient will ask her future consultants to keep me informed as to their recommendations.  Patient will stay in touch as well. ? ?Armandina Gemma, MD ?[facility] Surgery ?A [facility] practice ?Office: [phone] ? ?

## 2022-02-15 ENCOUNTER — Other Ambulatory Visit: Payer: Self-pay | Admitting: Endocrinology

## 2022-02-15 DIAGNOSIS — C73 Malignant neoplasm of thyroid gland: Secondary | ICD-10-CM

## 2022-02-20 ENCOUNTER — Other Ambulatory Visit: Payer: Self-pay | Admitting: Endocrinology

## 2022-02-20 DIAGNOSIS — C73 Malignant neoplasm of thyroid gland: Secondary | ICD-10-CM

## 2022-02-21 ENCOUNTER — Other Ambulatory Visit: Payer: Self-pay | Admitting: Obstetrics and Gynecology

## 2022-02-21 DIAGNOSIS — Z803 Family history of malignant neoplasm of breast: Secondary | ICD-10-CM

## 2022-02-23 ENCOUNTER — Other Ambulatory Visit (HOSPITAL_COMMUNITY): Payer: Self-pay | Admitting: Endocrinology

## 2022-02-23 ENCOUNTER — Other Ambulatory Visit (HOSPITAL_COMMUNITY): Payer: Self-pay | Admitting: Obstetrics and Gynecology

## 2022-02-23 DIAGNOSIS — C73 Malignant neoplasm of thyroid gland: Secondary | ICD-10-CM

## 2022-02-23 DIAGNOSIS — Z803 Family history of malignant neoplasm of breast: Secondary | ICD-10-CM

## 2022-02-26 ENCOUNTER — Ambulatory Visit (HOSPITAL_COMMUNITY): Payer: No Typology Code available for payment source

## 2022-02-27 ENCOUNTER — Ambulatory Visit (HOSPITAL_COMMUNITY)
Admission: RE | Admit: 2022-02-27 | Discharge: 2022-02-27 | Disposition: A | Discharge status: Home / Self Care | Payer: No Typology Code available for payment source | Source: Ambulatory Visit | Attending: Endocrinology | Admitting: Endocrinology

## 2022-02-27 ENCOUNTER — Encounter (HOSPITAL_COMMUNITY): Payer: Self-pay | Admitting: Radiology

## 2022-02-27 DIAGNOSIS — C73 Malignant neoplasm of thyroid gland: Secondary | ICD-10-CM | POA: Diagnosis not present

## 2022-02-27 MED ORDER — GADOBUTROL 1 MMOL/ML IV SOLN
7.0000 mL | Freq: Once | INTRAVENOUS | Status: AC | PRN
  Administered 2022-02-27: 7 mL via INTRAVENOUS

## 2022-03-01 ENCOUNTER — Ambulatory Visit (HOSPITAL_COMMUNITY): Payer: No Typology Code available for payment source

## 2022-03-02 ENCOUNTER — Encounter (HOSPITAL_COMMUNITY): Payer: Self-pay

## 2022-03-02 ENCOUNTER — Ambulatory Visit (HOSPITAL_COMMUNITY): Payer: No Typology Code available for payment source

## 2022-03-04 ENCOUNTER — Ambulatory Visit (HOSPITAL_COMMUNITY)
Admission: RE | Admit: 2022-03-04 | Discharge: 2022-03-04 | Disposition: A | Discharge status: Home / Self Care | Payer: No Typology Code available for payment source | Source: Ambulatory Visit | Attending: Endocrinology | Admitting: Endocrinology

## 2022-03-04 DIAGNOSIS — C73 Malignant neoplasm of thyroid gland: Secondary | ICD-10-CM

## 2022-03-04 IMAGING — MR MR THORACIC SPINE WO/W CM
6 of 7 series · 27 of 48 positions shown · IV contrast (gadavist)
Comparison: [DATE] PET-CT report from [facility].

CLINICAL DATA: Thyroid carcinoma.

EXAM:
MRI THORACIC WITHOUT AND WITH CONTRAST
TECHNIQUE: Multiplanar and multiecho pulse sequences of the thoracic spine were
obtained without and with intravenous contrast.
CONTRAST:  5mL GADAVIST GADOBUTROL 1 MMOL/ML IV SOLN

[Series 13: T1 · sagittal · 4.0mm · 1.72mm/px · 1 of 8 slices shown (1 of 3)]
[im 1/8]
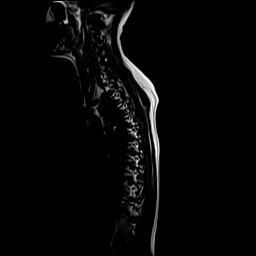

[Series 14: STIR · sagittal · 3.0mm · 1.00mm/px · 2 of 15 slices shown]
[im 1/15]
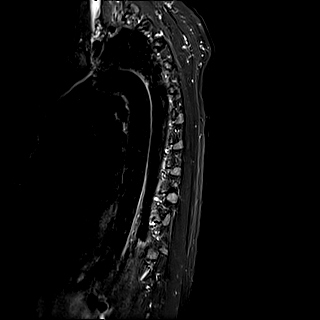
[im 15/15]
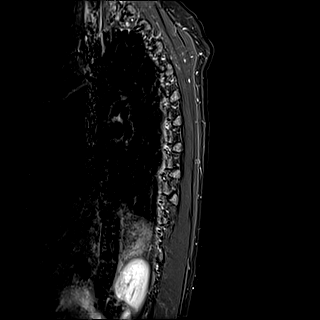

[Series 15: T1 · sagittal · 3.0mm · 1.00mm/px · 3 of 15 slices shown (2 of 3)]
[im 1/15]
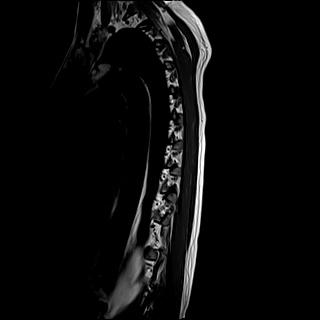
[im 8/15]
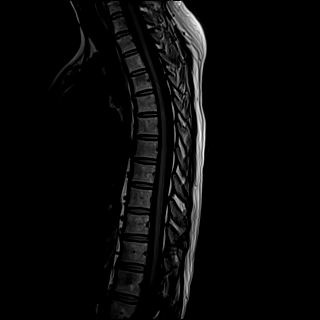
[im 15/15]
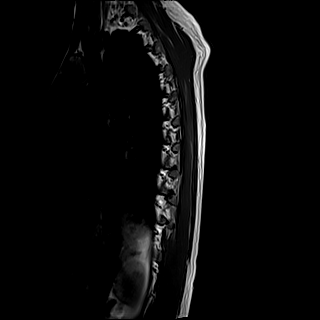

[Series 16: T2 · axial · 4.0mm · 0.78mm/px · z∈[-322,-71]mm · 7 of 39 slices shown]
[im 1/39]
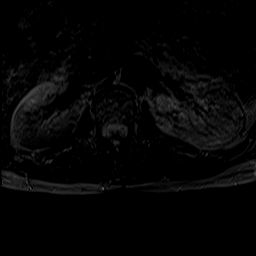
[im 7/39]
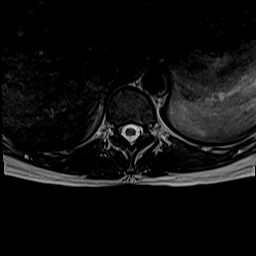
[im 13/39]
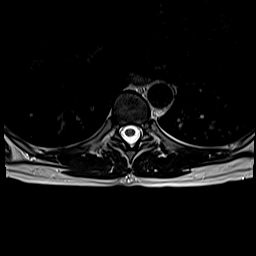
[im 20/39]
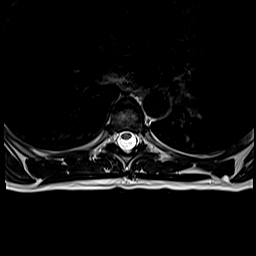
[im 26/39]
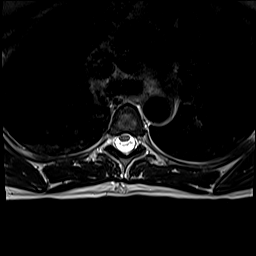
[im 32/39]
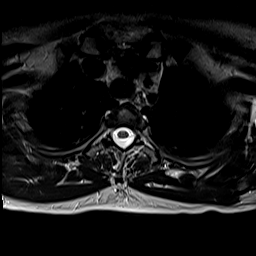
[im 39/39]
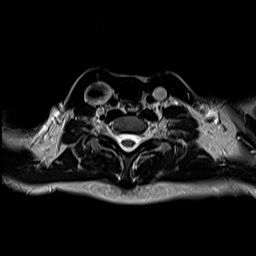

[Series 17: t2_me2d_tra · axial · 4.0mm · 0.39mm/px · z∈[-322,-71]mm · 7 of 39 slices shown]
[im 1/39]
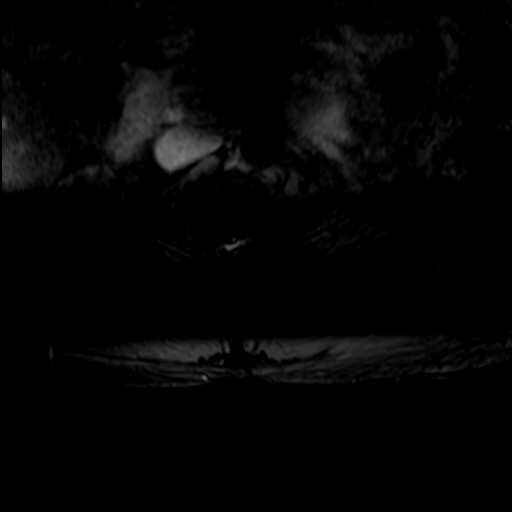
[im 7/39]
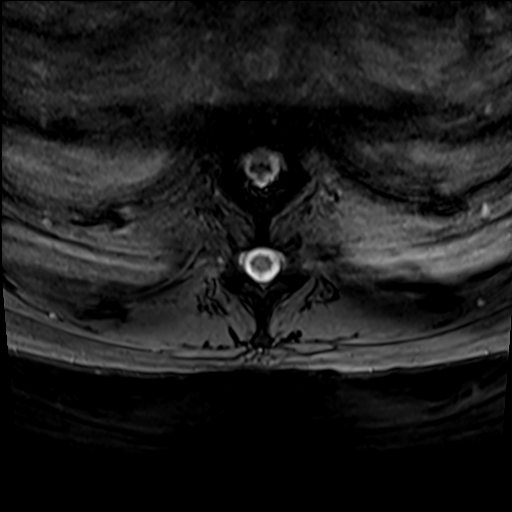
[im 13/39]
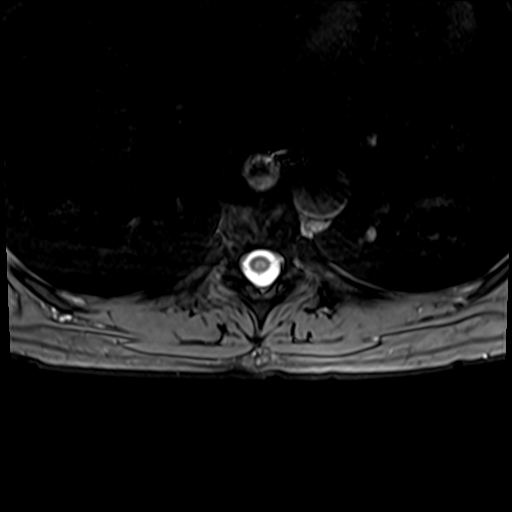
[im 20/39]
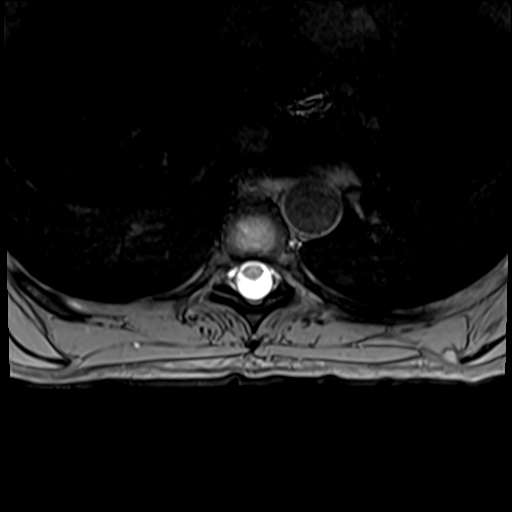
[im 26/39]
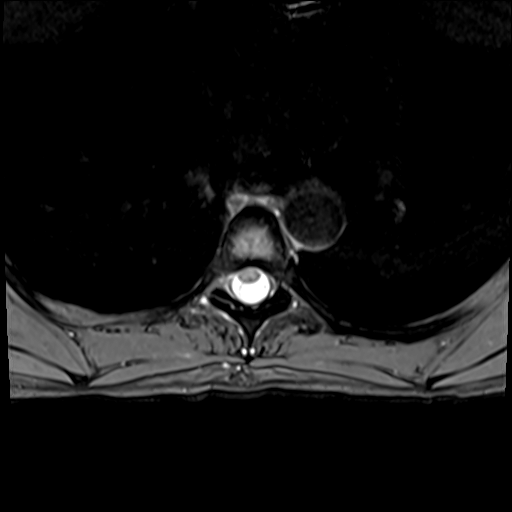
[im 32/39]
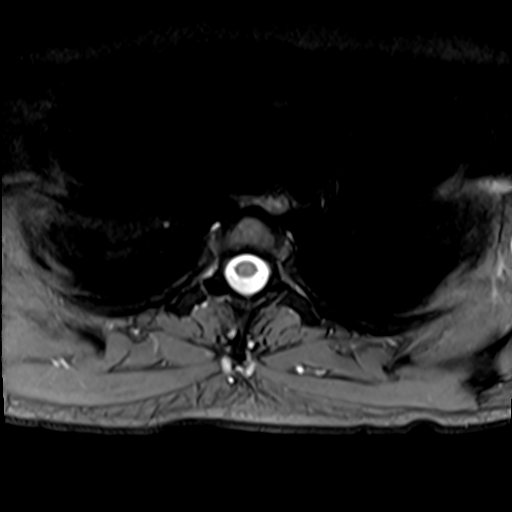
[im 39/39]
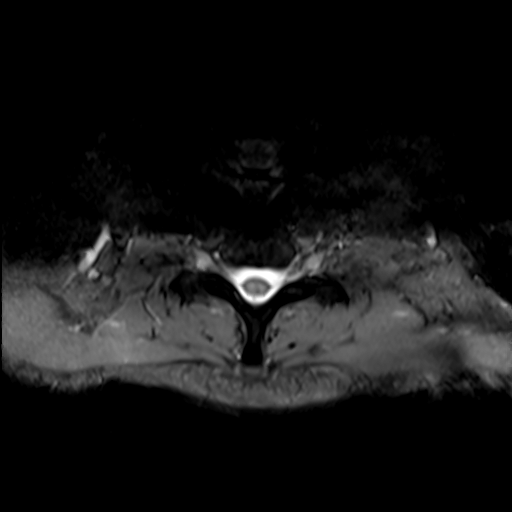

[Series 18: T1 · axial · 4.0mm · 0.39mm/px · z∈[-322,-71]mm · 7 of 39 slices shown (3 of 3)]
[im 1/39]
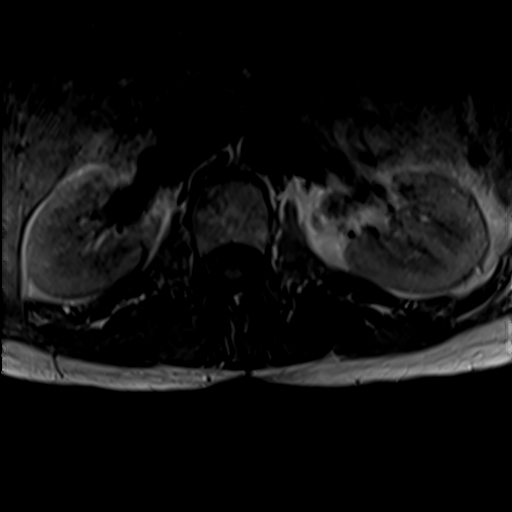
[im 7/39]
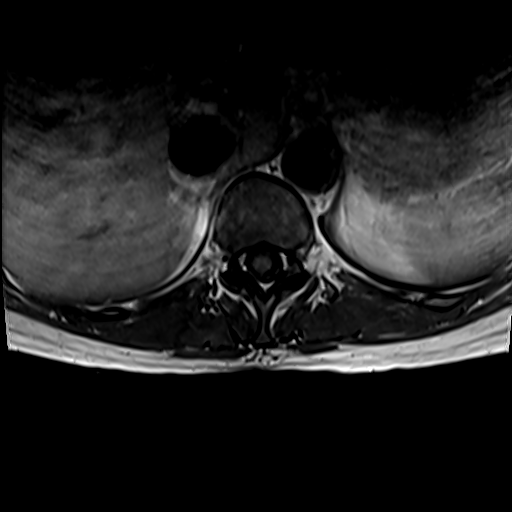
[im 13/39]
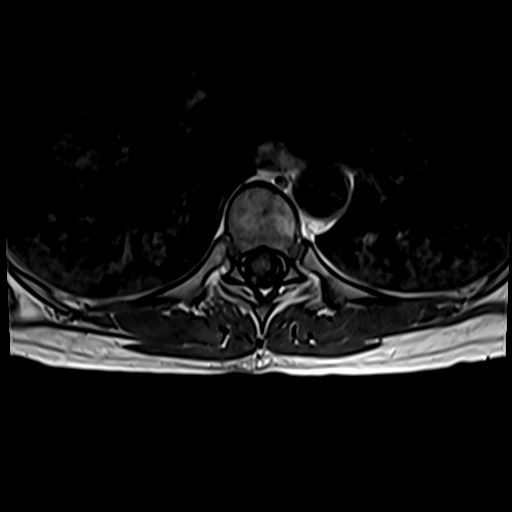
[im 20/39]
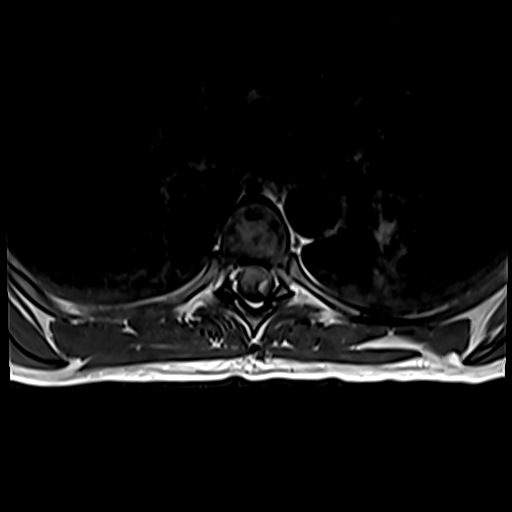
[im 26/39]
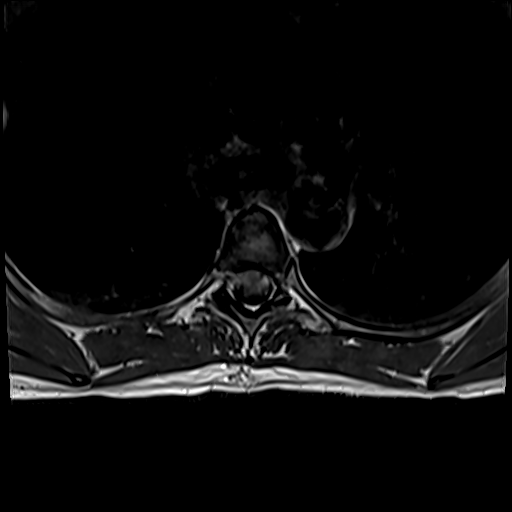
[im 32/39]
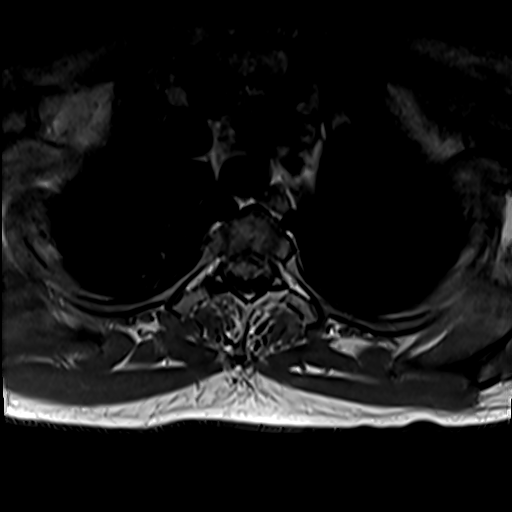
[im 39/39]
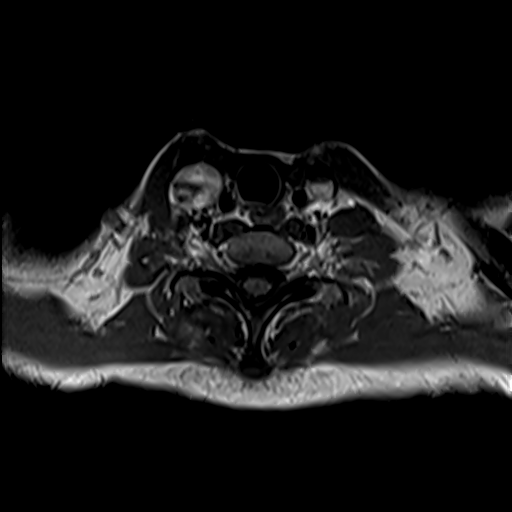

[27 of 48 positions shown; findings below may reference images not displayed]

FINDINGS: Alignment:  Normal.

Vertebrae: No fracture, suspicious marrow lesion, or significant
marrow edema.

Cord: Normal cord signal and morphology. No abnormal intradural
enhancement.

Paraspinal and other soft tissues: 1.4 cm T2 hyperintense focus in
the right hepatic lobe, incompletely characterized however this
likely corresponds to the suspected benign cyst described in the
prior outside PET-CT report.

Disc levels:

Preserved disc space heights.  No disc herniation or stenosis.
IMPRESSION: No evidence of metastatic disease in the thoracic spine.

## 2022-03-04 IMAGING — MR MR LUMBAR SPINE WO/W CM
5 of 7 series · 31 of 48 positions shown · IV contrast (5ml GADAVIST)
Comparison: None Available.

CLINICAL DATA: Thyroid carcinoma.

EXAM:
MRI LUMBAR SPINE WITHOUT AND WITH CONTRAST
TECHNIQUE: Multiplanar and multiecho pulse sequences of the lumbar spine were
obtained without and with intravenous contrast.
CONTRAST:  5mL GADAVIST GADOBUTROL 1 MMOL/ML IV SOLN

[Series 10: T1 · sagittal · 4.0mm · 0.81mm/px · 3 of 17 slices shown (1 of 2)]
[im 1/17]
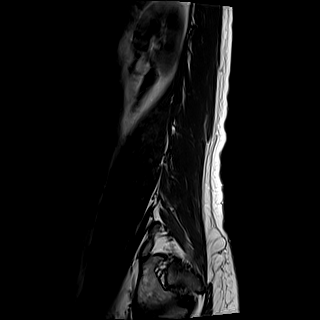
[im 9/17]
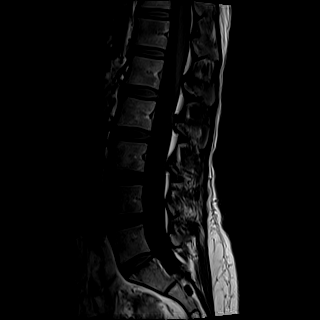
[im 17/17]
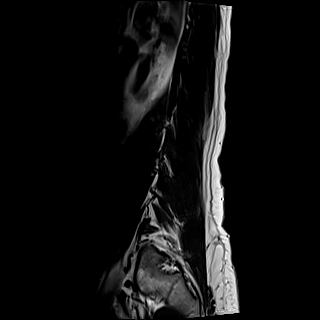

[Series 12: T2 · axial · 4.0mm · 0.62mm/px · z∈[-519,-297]mm · 11 of 43 slices shown]
[im 1/43]
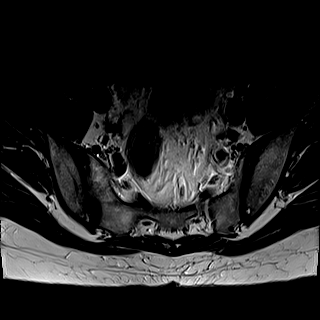
[im 5/43]
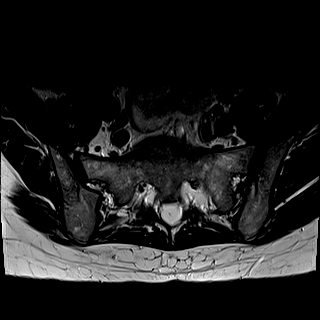
[im 9/43]
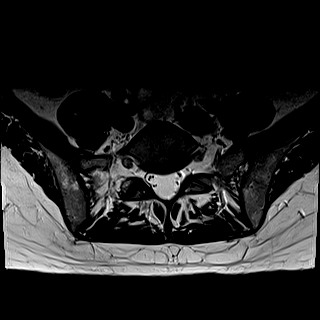
[im 13/43]
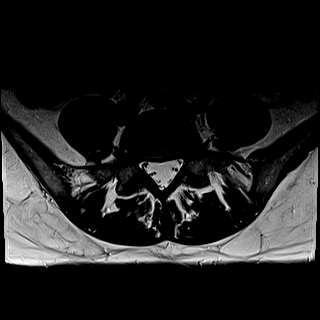
[im 17/43]
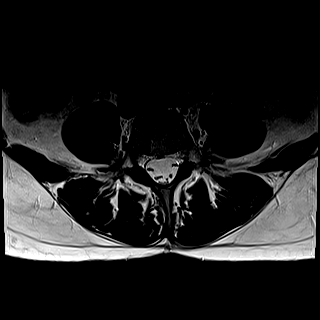
[im 22/43]
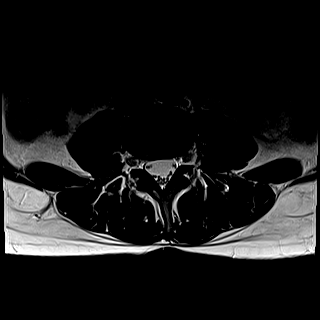
[im 26/43]
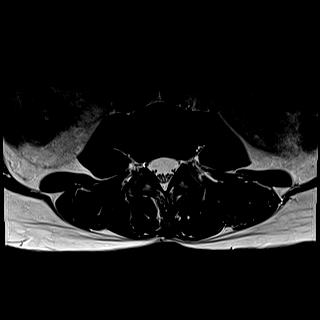
[im 30/43]
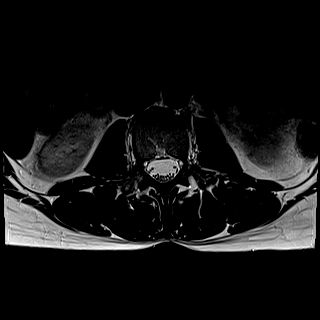
[im 34/43]
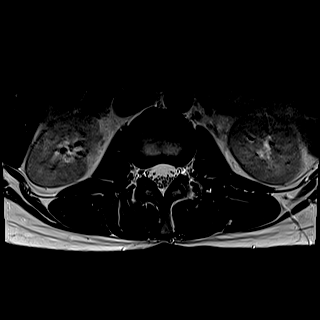
[im 38/43]
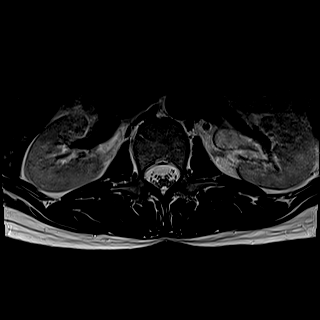
[im 43/43]
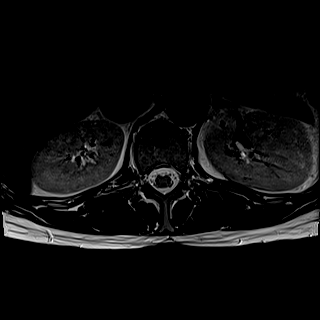

[Series 13: T1 · axial · 4.0mm · 0.39mm/px · z∈[-519,-297]mm · 11 of 43 slices shown (2 of 2)]
[im 1/43]
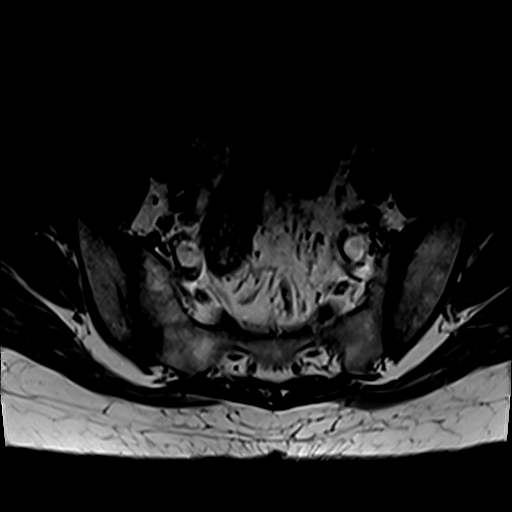
[im 5/43]
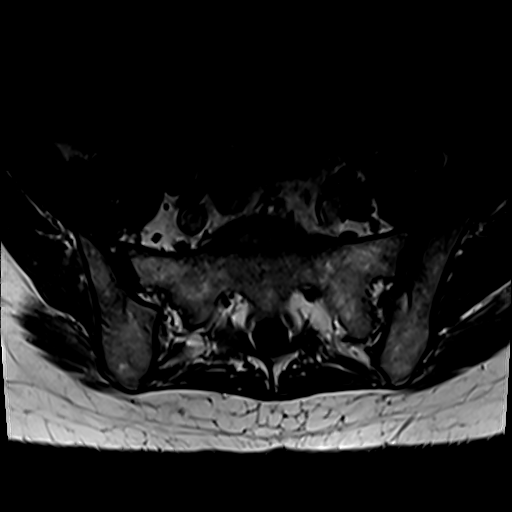
[im 9/43]
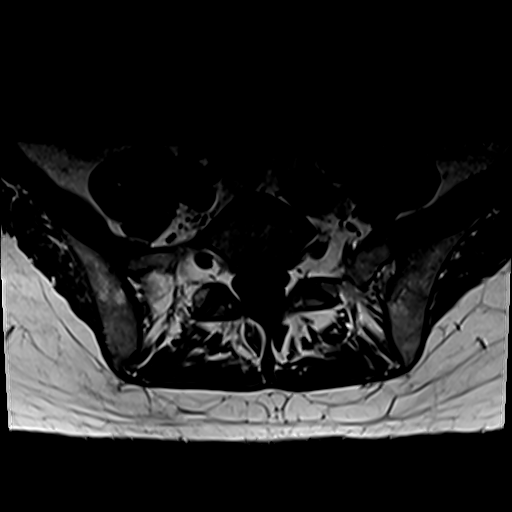
[im 13/43]
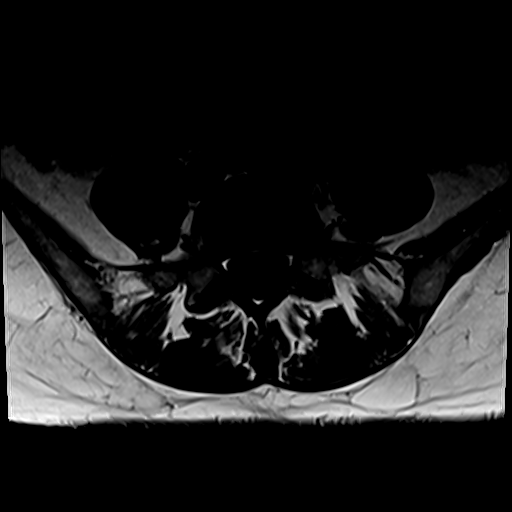
[im 17/43]
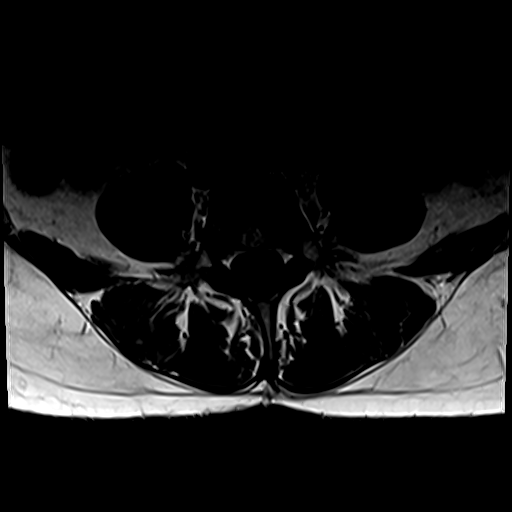
[im 22/43]
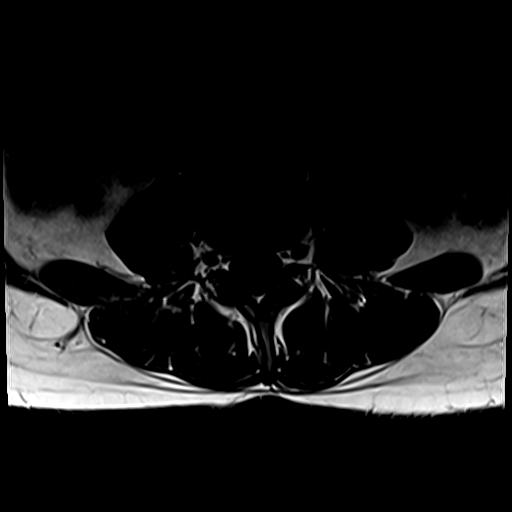
[im 26/43]
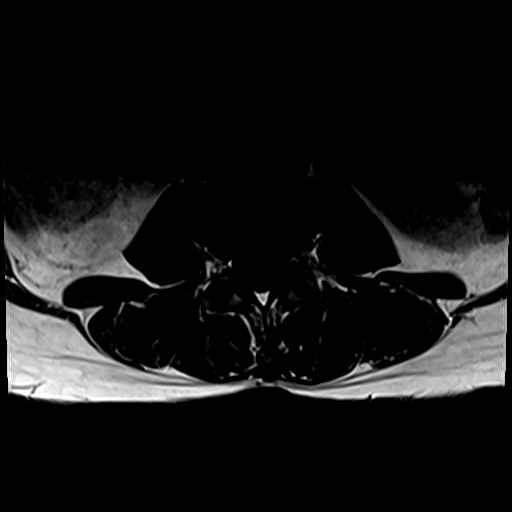
[im 30/43]
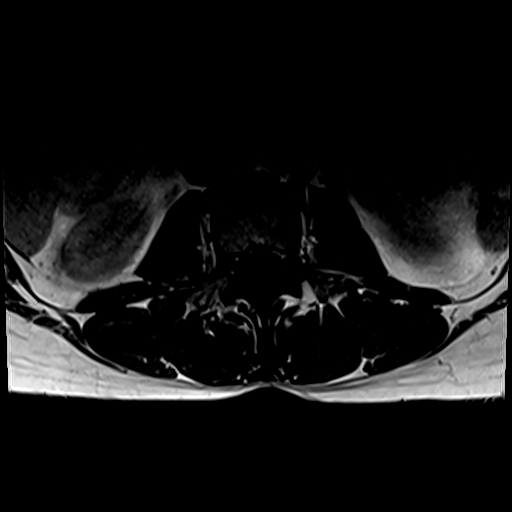
[im 34/43]
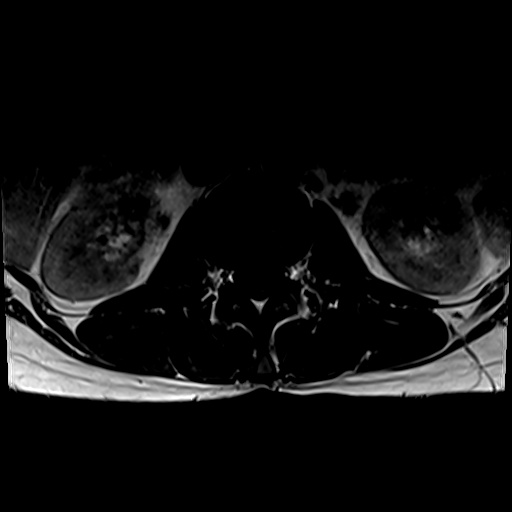
[im 38/43]
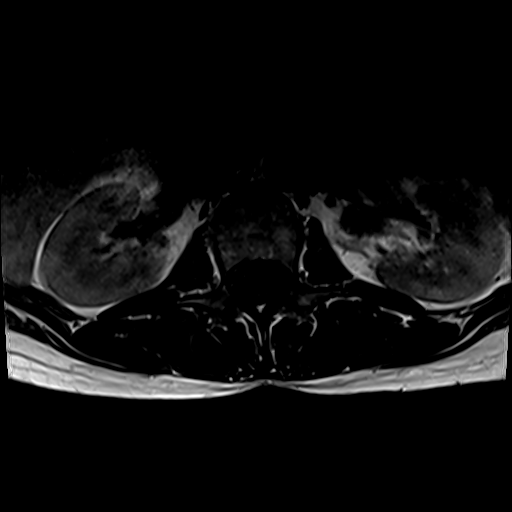
[im 43/43]
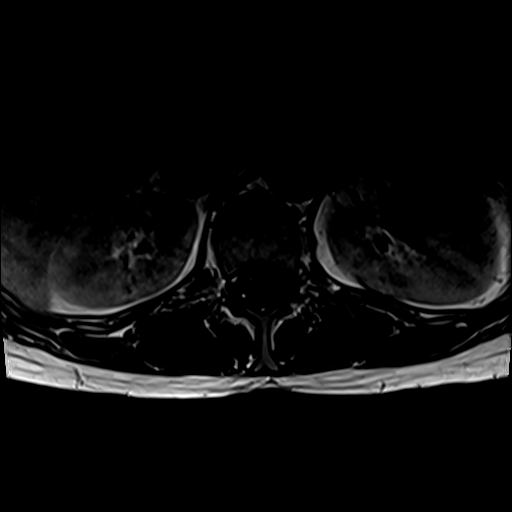

[Series 14: T2 post-contrast · sagittal · 4.0mm · 0.81mm/px · 4 of 15 slices shown]
[im 1/15]
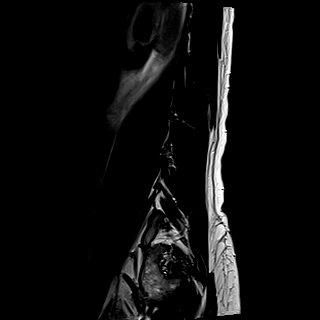
[im 5/15]
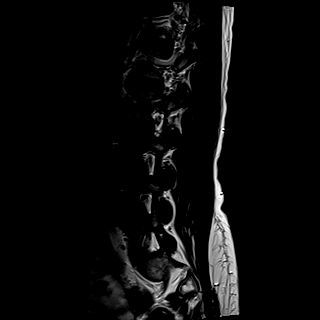
[im 10/15]
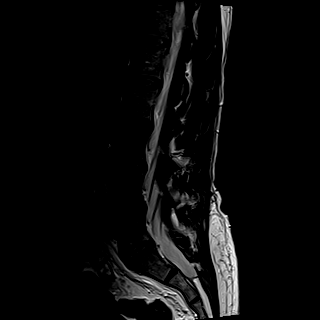
[im 15/15]
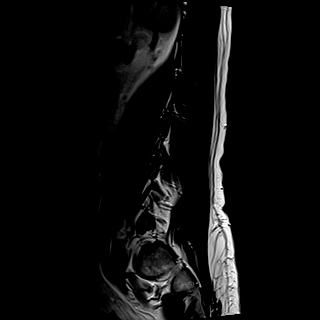

[Series 19: T1 fat-sat post-contrast · sagittal · 4.0mm · 0.81mm/px · 2 of 17 slices shown]
[im 1/17]
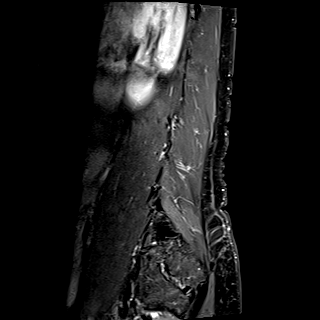
[im 6/17]
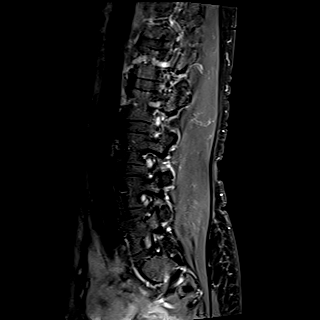

[31 of 48 positions shown; findings below may reference images not displayed]

FINDINGS: Segmentation:  Standard.

Alignment:  Normal.

Vertebrae: No fracture, suspicious marrow lesion, or significant
marrow edema.

Conus medullaris and cauda equina: Conus extends to the L1-2 level.
Conus and cauda equina appear normal.

Paraspinal and other soft tissues: Unremarkable.

Disc levels:

Preserved disc height and hydration throughout the lumbar spine. No
sizable disc herniation or stenosis.
IMPRESSION: Negative lumbar spine MRI.  No evidence of metastatic disease.

## 2022-03-04 IMAGING — MR MR THORACIC SPINE WO/W CM
3 series · 29 of 48 positions shown · IV contrast (5ml GADAVIST)
Comparison: [DATE] PET-CT report from [facility].

CLINICAL DATA: Thyroid carcinoma.

EXAM:
MRI THORACIC WITHOUT AND WITH CONTRAST
TECHNIQUE: Multiplanar and multiecho pulse sequences of the thoracic spine were
obtained without and with intravenous contrast.
CONTRAST:  5mL GADAVIST GADOBUTROL 1 MMOL/ML IV SOLN

[Series 15: T2 post-contrast · sagittal · 3.0mm · 0.83mm/px · 11 of 15 slices shown]
[im 1/15]
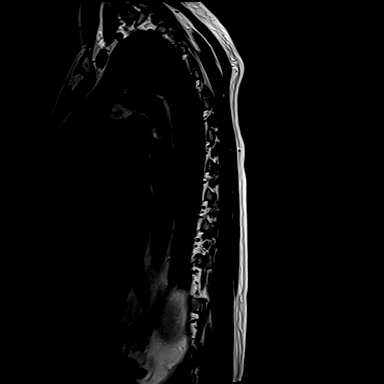
[im 2/15]
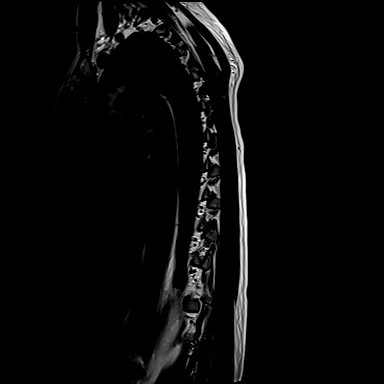
[im 3/15]
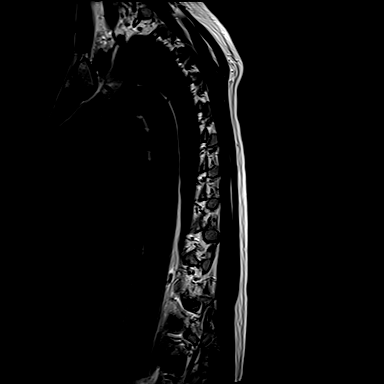
[im 5/15]
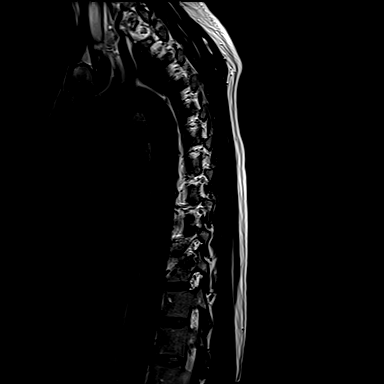
[im 6/15]
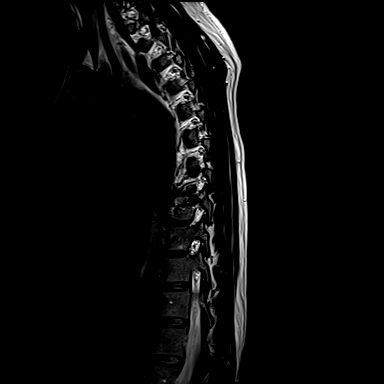
[im 8/15]
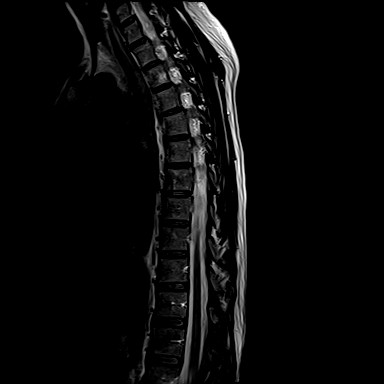
[im 9/15]
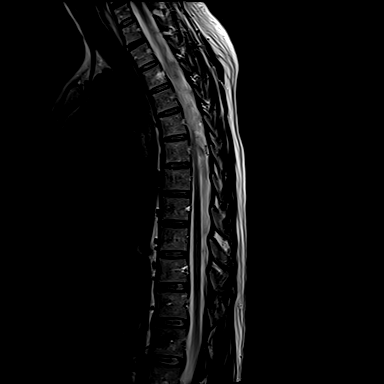
[im 10/15]
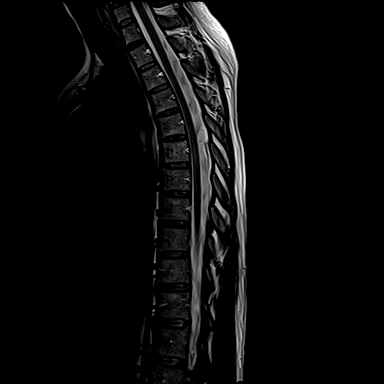
[im 12/15]
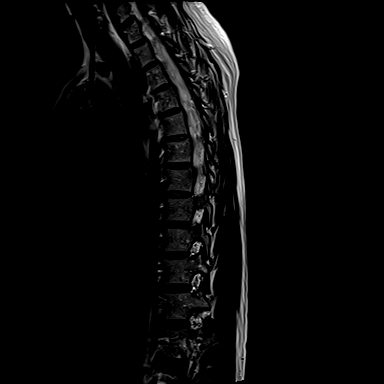
[im 13/15]
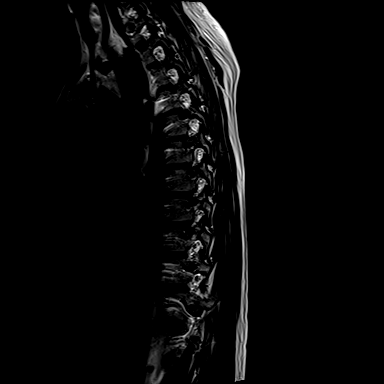
[im 15/15]
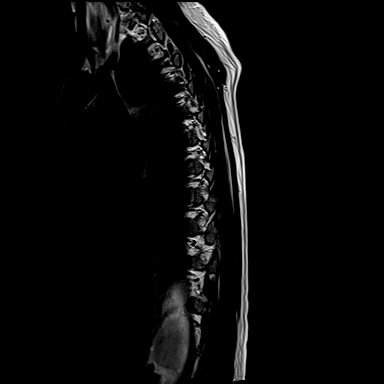

[Series 16: T1 fat-sat post-contrast · sagittal · 3.0mm · 1.00mm/px · 8 of 15 slices shown]
[im 1/15]
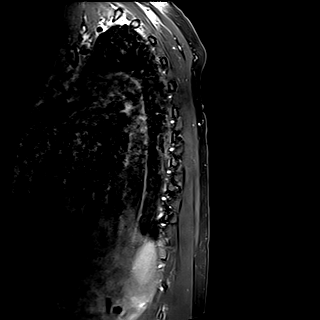
[im 2/15]
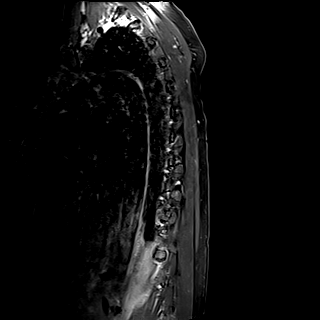
[im 5/15]
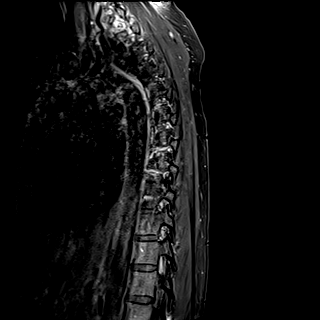
[im 7/15]
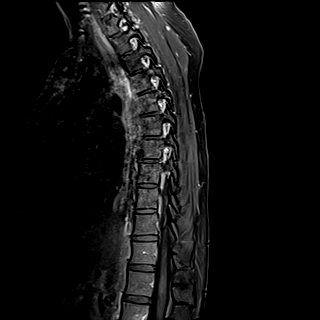
[im 8/15]
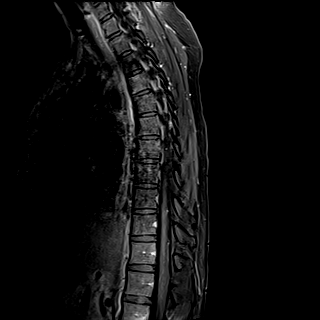
[im 10/15]
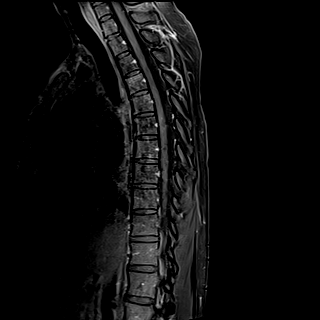
[im 13/15]
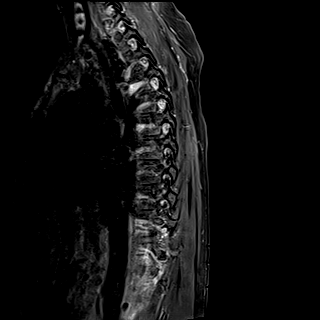
[im 15/15]
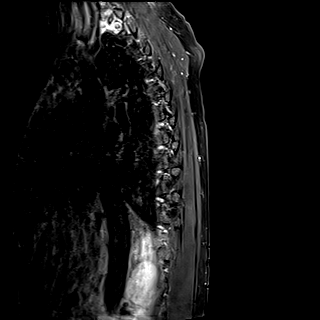

[Series 17: T1 post-contrast · axial · 4.0mm · 0.39mm/px · z∈[-317,-99]mm · 10 of 39 slices shown]
[im 2/39]
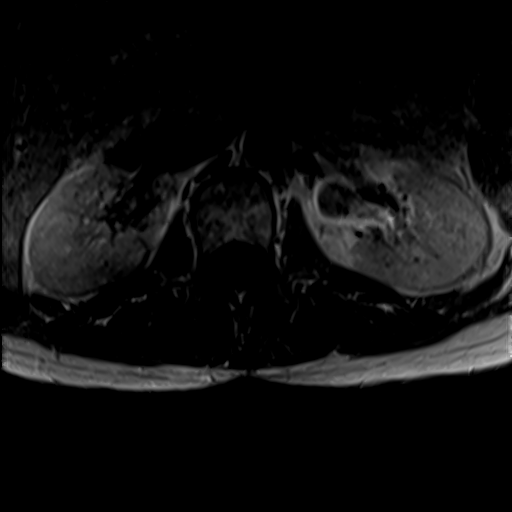
[im 6/39]
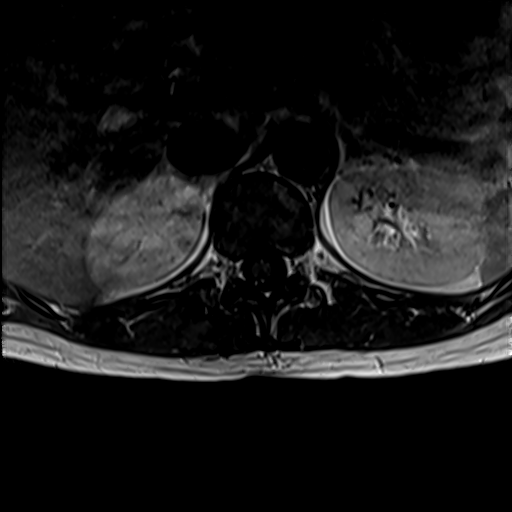
[im 8/39]
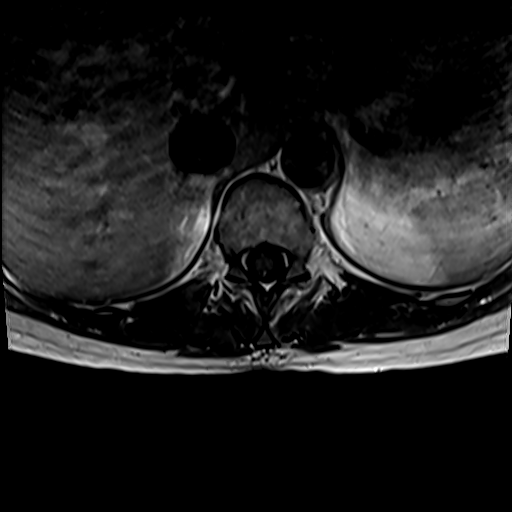
[im 12/39]
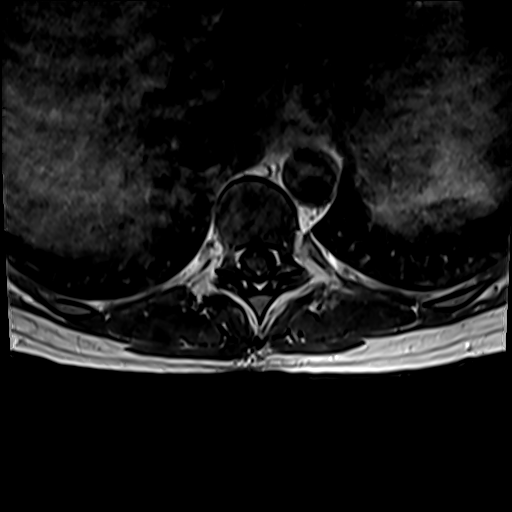
[im 17/39]
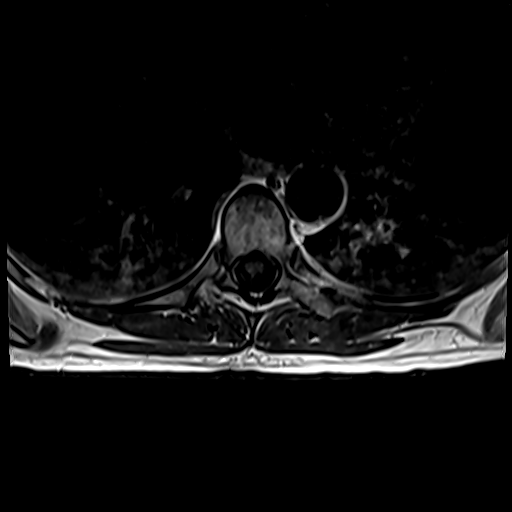
[im 20/39]
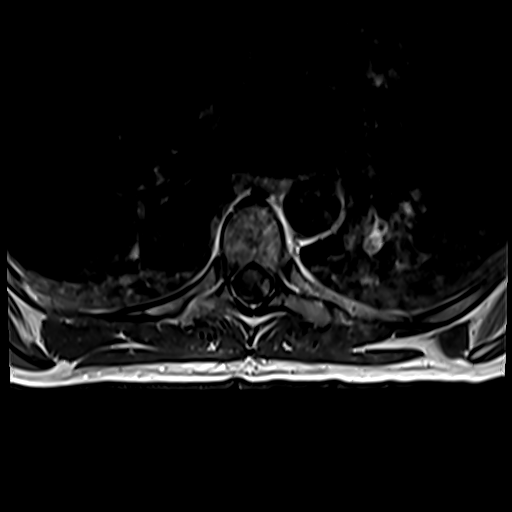
[im 22/39]
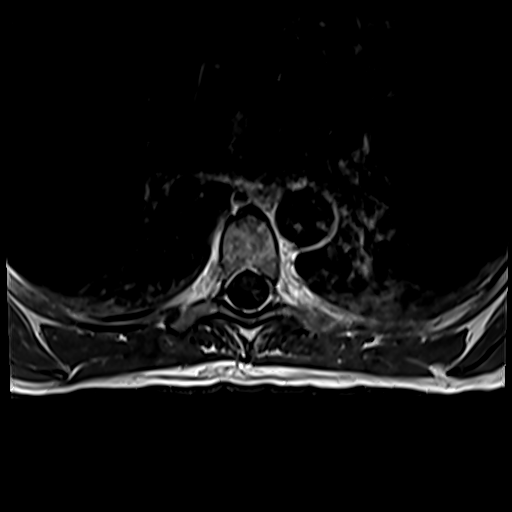
[im 27/39]
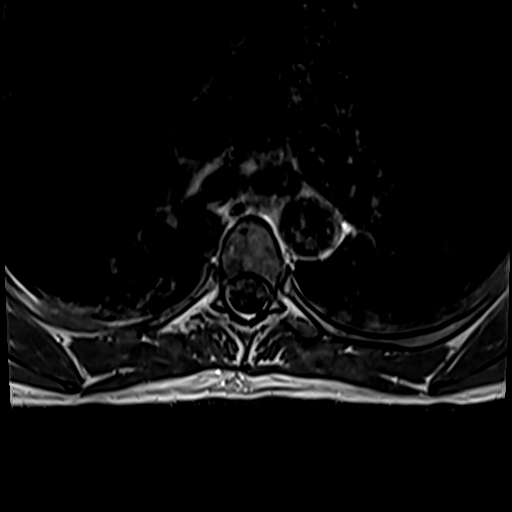
[im 31/39]
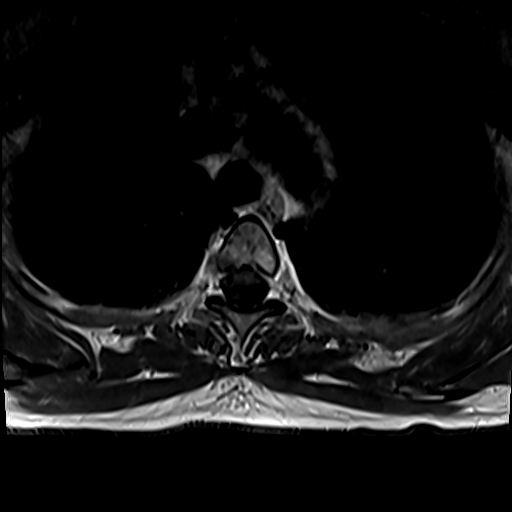
[im 33/39]
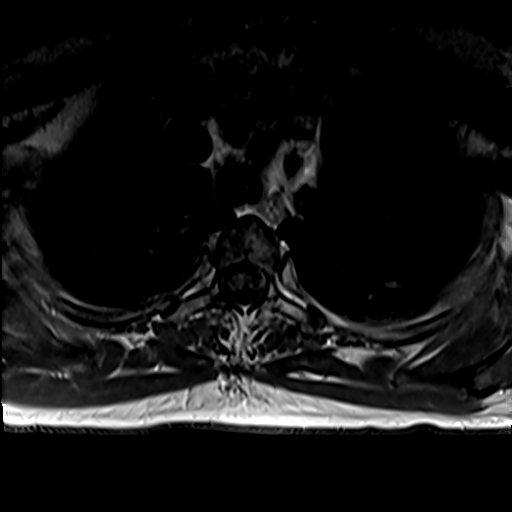

[29 of 48 positions shown; findings below may reference images not displayed]

FINDINGS: Alignment:  Normal.

Vertebrae: No fracture, suspicious marrow lesion, or significant
marrow edema.

Cord: Normal cord signal and morphology. No abnormal intradural
enhancement.

Paraspinal and other soft tissues: 1.4 cm T2 hyperintense focus in
the right hepatic lobe, incompletely characterized however this
likely corresponds to the suspected benign cyst described in the
prior outside PET-CT report.

Disc levels:

Preserved disc space heights.  No disc herniation or stenosis.
IMPRESSION: No evidence of metastatic disease in the thoracic spine.

## 2022-03-04 IMAGING — MR MR CERVICAL SPINE WO/W CM
7 of 8 series · 35 of 48 positions shown · IV contrast (gadavist)
Comparison: None Available.

CLINICAL DATA: Thyroid carcinoma.

EXAM:
MRI CERVICAL SPINE WITHOUT AND WITH CONTRAST
TECHNIQUE: Multiplanar and multiecho pulse sequences of the cervical spine, to
include the craniocervical junction and cervicothoracic junction,
were obtained without and with intravenous contrast.
CONTRAST:  5mL GADAVIST GADOBUTROL 1 MMOL/ML IV SOLN

[Series 6: T1 · sagittal · 3.0mm · 0.69mm/px · 4 of 15 slices shown (1 of 2)]
[im 1/15]
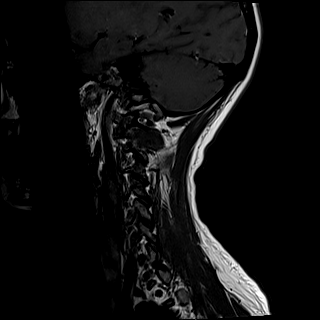
[im 5/15]
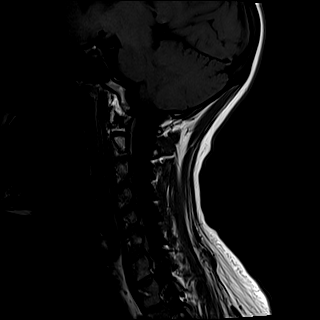
[im 10/15]
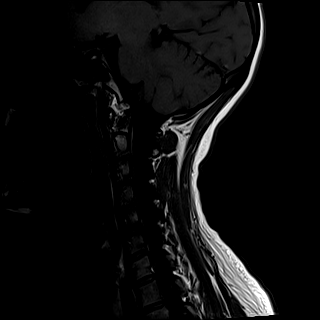
[im 15/15]
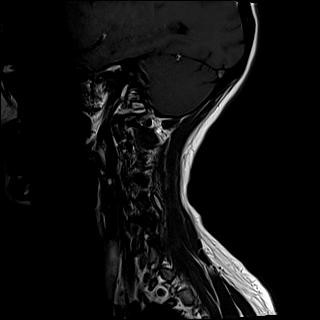

[Series 7: STIR · sagittal · 3.0mm · 0.86mm/px · 4 of 15 slices shown]
[im 1/15]
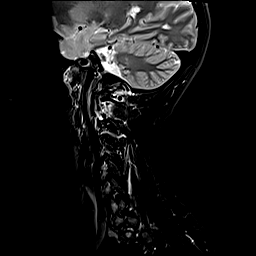
[im 5/15]
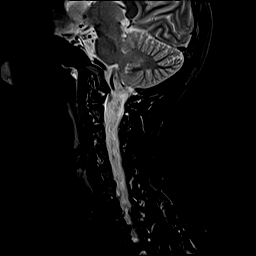
[im 10/15]
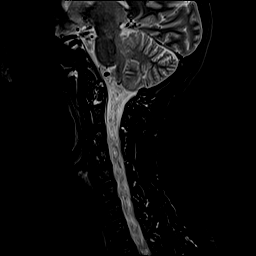
[im 15/15]
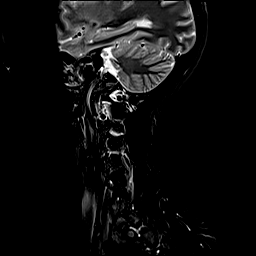

[Series 8: T2 · axial · 3.0mm · 0.70mm/px · z∈[-78,+36]mm · 8 of 36 slices shown]
[im 1/36]
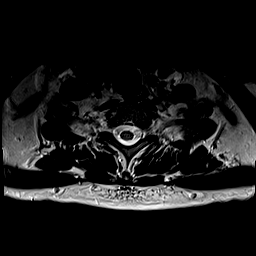
[im 6/36]
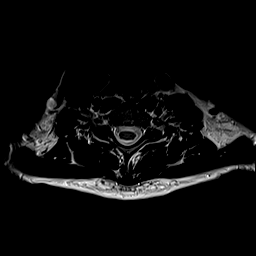
[im 11/36]
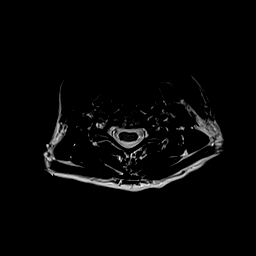
[im 16/36]
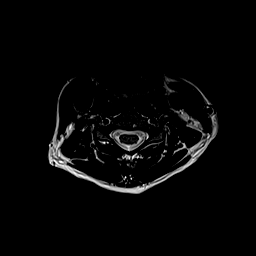
[im 21/36]
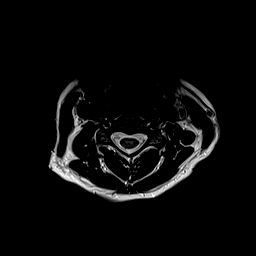
[im 26/36]
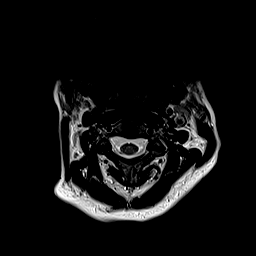
[im 31/36]
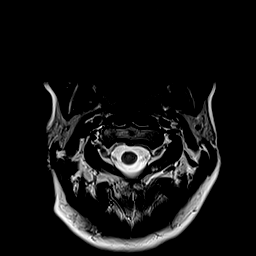
[im 36/36]
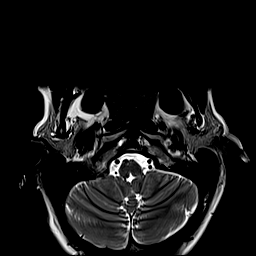

[Series 10: T1 · axial · 3.0mm · 0.35mm/px · z∈[-78,+36]mm · 8 of 36 slices shown (2 of 2)]
[im 1/36]
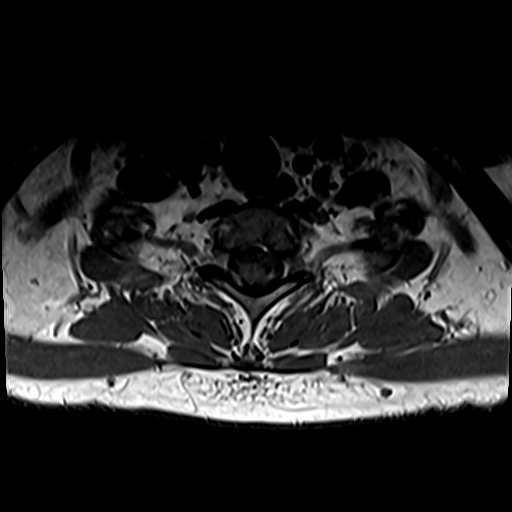
[im 6/36]
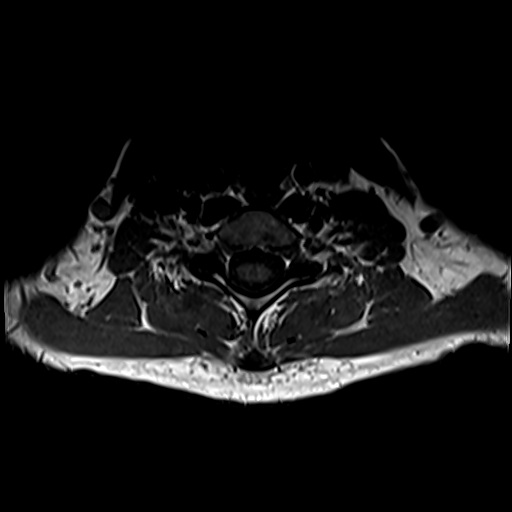
[im 11/36]
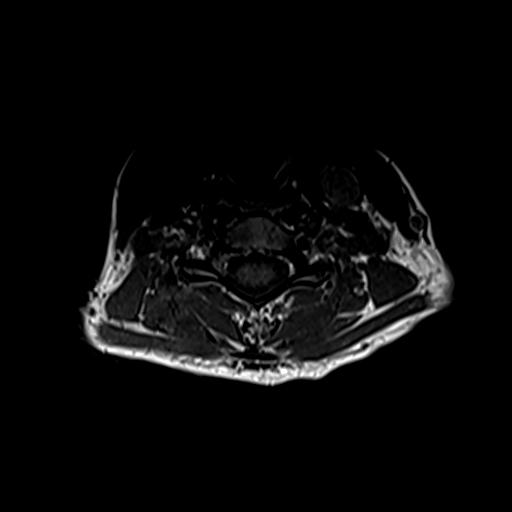
[im 16/36]
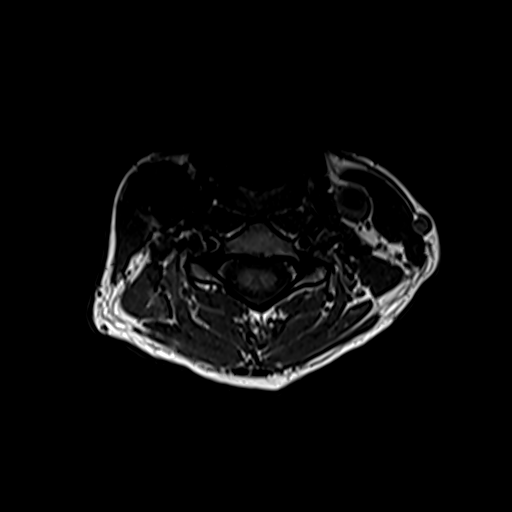
[im 21/36]
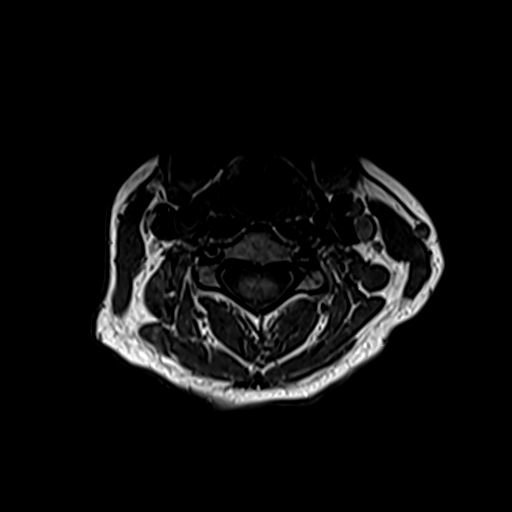
[im 26/36]
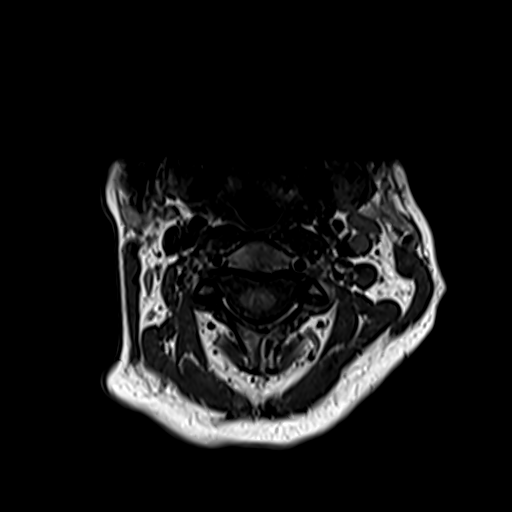
[im 31/36]
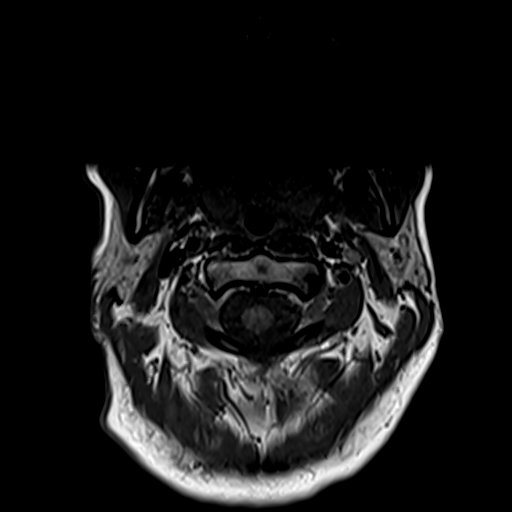
[im 36/36]
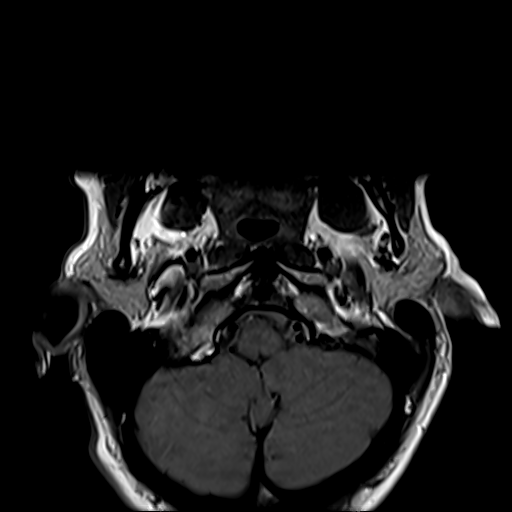

[Series 11: T2 post-contrast · sagittal · 3.0mm · 0.69mm/px · 4 of 15 slices shown]
[im 1/15]
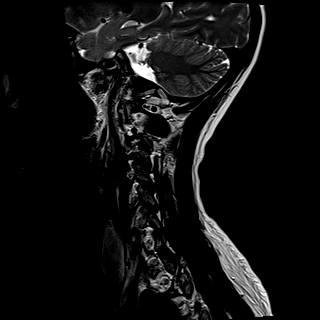
[im 5/15]
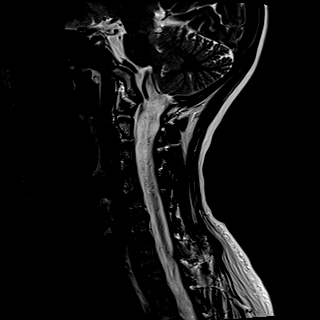
[im 10/15]
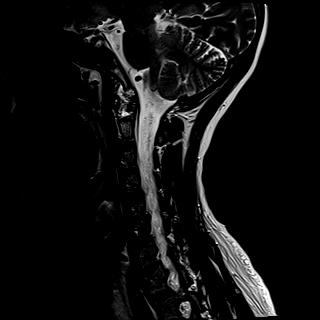
[im 15/15]
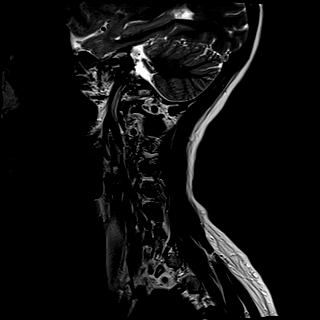

[Series 12: T1 fat-sat post-contrast · sagittal · 3.0mm · 0.69mm/px · 4 of 15 slices shown]
[im 1/15]
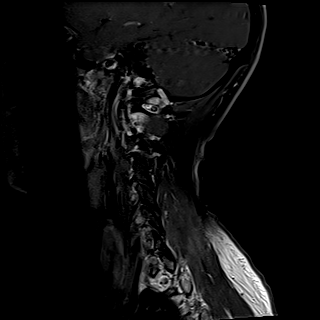
[im 5/15]
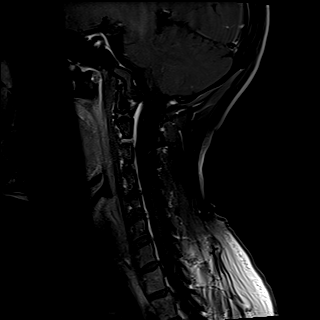
[im 10/15]
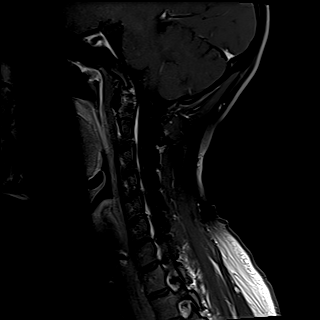
[im 15/15]
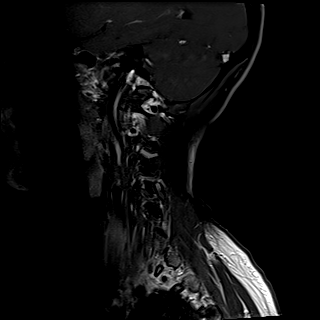

[Series 13: T1 post-contrast · axial · 3.0mm · 0.39mm/px · z∈[-82,-49]mm · 3 of 36 slices shown]
[im 1/36]
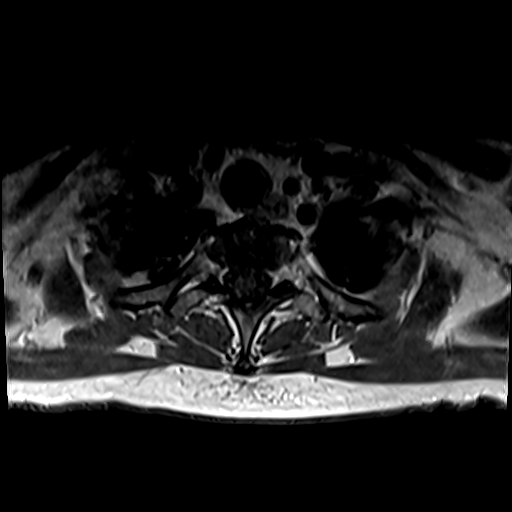
[im 6/36]
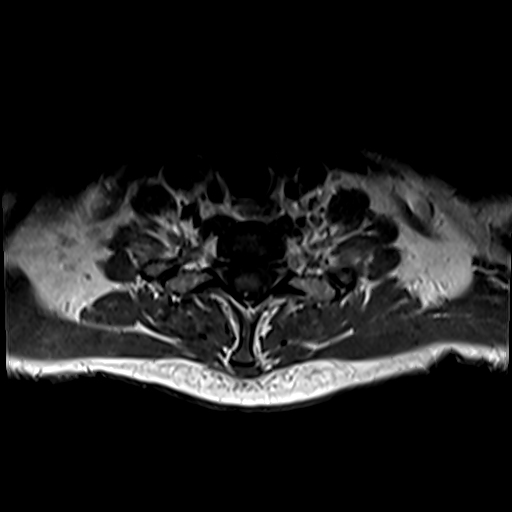
[im 11/36]
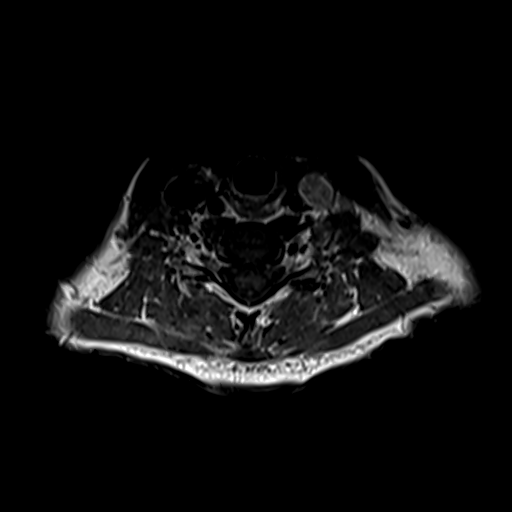

[35 of 48 positions shown; findings below may reference images not displayed]

FINDINGS: Alignment: Normal.

Vertebrae: No fracture, suspicious marrow lesion, or significant
marrow edema.

Cord: Normal cord signal and morphology. No abnormal intradural
enhancement.

Posterior Fossa, vertebral arteries, paraspinal tissues:
Unremarkable.

Disc levels:

Mild disc bulging at C5-6 and minimal disc bulging at C6-7 without
associated stenosis or spinal cord mass effect.
IMPRESSION: 1. No evidence of metastatic disease in the cervical spine.
2. Minimal spondylosis without stenosis.

## 2022-03-04 MED ORDER — GADOBUTROL 1 MMOL/ML IV SOLN
5.0000 mL | Freq: Once | INTRAVENOUS | Status: AC | PRN
  Administered 2022-03-04: 5 mL via INTRAVENOUS

## 2022-03-05 ENCOUNTER — Ambulatory Visit (HOSPITAL_COMMUNITY): Payer: No Typology Code available for payment source

## 2022-03-20 ENCOUNTER — Ambulatory Visit (HOSPITAL_COMMUNITY)
Admission: RE | Admit: 2022-03-20 | Discharge: 2022-03-20 | Disposition: A | Discharge status: Home / Self Care | Payer: No Typology Code available for payment source | Source: Ambulatory Visit | Attending: Obstetrics and Gynecology | Admitting: Obstetrics and Gynecology

## 2022-03-20 DIAGNOSIS — C73 Malignant neoplasm of thyroid gland: Secondary | ICD-10-CM | POA: Insufficient documentation

## 2022-03-20 DIAGNOSIS — Z803 Family history of malignant neoplasm of breast: Secondary | ICD-10-CM | POA: Diagnosis present

## 2022-03-20 DIAGNOSIS — Z1239 Encounter for other screening for malignant neoplasm of breast: Secondary | ICD-10-CM | POA: Diagnosis not present

## 2022-03-20 MED ORDER — GADOBUTROL 1 MMOL/ML IV SOLN
5.0000 mL | Freq: Once | INTRAVENOUS | Status: AC | PRN
  Administered 2022-03-20: 5 mL via INTRAVENOUS

## 2022-03-23 ENCOUNTER — Other Ambulatory Visit: Payer: Self-pay | Admitting: Family Medicine

## 2022-03-23 DIAGNOSIS — E079 Disorder of thyroid, unspecified: Secondary | ICD-10-CM

## 2022-03-23 DIAGNOSIS — M25551 Pain in right hip: Secondary | ICD-10-CM

## 2022-03-23 DIAGNOSIS — C73 Malignant neoplasm of thyroid gland: Secondary | ICD-10-CM

## 2022-03-30 ENCOUNTER — Ambulatory Visit (HOSPITAL_COMMUNITY)
Admission: RE | Admit: 2022-03-30 | Discharge: 2022-03-30 | Disposition: A | Discharge status: Home / Self Care | Payer: No Typology Code available for payment source | Source: Ambulatory Visit | Attending: Family Medicine | Admitting: Family Medicine

## 2022-03-30 DIAGNOSIS — C7951 Secondary malignant neoplasm of bone: Secondary | ICD-10-CM | POA: Insufficient documentation

## 2022-03-30 DIAGNOSIS — M25551 Pain in right hip: Secondary | ICD-10-CM | POA: Insufficient documentation

## 2022-03-30 DIAGNOSIS — M25552 Pain in left hip: Secondary | ICD-10-CM | POA: Insufficient documentation

## 2022-03-30 DIAGNOSIS — C73 Malignant neoplasm of thyroid gland: Secondary | ICD-10-CM | POA: Diagnosis present

## 2022-03-30 MED ORDER — GADOBUTROL 1 MMOL/ML IV SOLN
5.5000 mL | Freq: Once | INTRAVENOUS | Status: AC | PRN
  Administered 2022-03-30: 5.5 mL via INTRAVENOUS

## 2022-04-02 NOTE — Progress Notes (Signed)
Head and Neck Cancer Location of Tumor / Histology: Thyroid Ca  08/02/2021 PET/CT Imaging Indication: 46 years Female Thyroid cancer, staging, C73 Malignant neoplasm of thyroid gland (CMS-HCC).  FINDINGS:  Head & Neck:  * Normal physiologic uptake is seen in the head and neck. * Nodes: Status post right neck dissection. No cervical adenopathy. * Thyroid: Thyroidectomy. No FDG avid soft tissue in the thyroidectomy bed.  Chest: * Nodes: No mediastinal, hilar, or axillary lymphadenopathy. * Cardiovascular: Unremarkable. * Pulmonary: No concerning nodules. No pleural effusions.  Abdomen & Pelvis: * Liver: No focal hypermetabolic lesions. Hypoattenuating 1.1 cm lesion near the hepatic dome is without focal FDG avidity, likely representing a benign hepatic cyst. * Gallbladder: Unremarkable. * Pancreas: Unremarkable. * Spleen: Normal FDG uptake, no focal lesions. * Adrenal glands: Unremarkable. * Kidneys: Unremarkable. * Genitourinary: Mild FDG avidity associated with the left ovary, within physiologic limits for patient age. * Gastrointestinal: Normal in caliber with normal physiologic uptake. * Vasculature: Unremarkable. * Nodes: No retroperitoneal, mesenteric, or pelvic adenopathy. * Peritoneum: No free fluid.   Musculoskeletal: * Soft tissues: Unremarkable.  * Bones: No concerning osseous lesions.   IMPRESSION: Status post thyroidectomy without FDG evidence of recurrent or metastatic disease. (Thyroid stimulation status is unknown).   Past/Anticipated interventions by medical oncology, if any: NA  Weight changes, if any: {:18581}  Swallowing concerns, if any:  PEG tube:  Nausea/Vomiting, if any: {:18581}  Pain issues, if any:  {:18581}  SAFETY ISSUES: Prior radiation? {:18581} Pacemaker/ICD? No Possible current pregnancy? {:18581} Is the patient on methotrexate? {:18581}  Current Complaints / other details:  Thyroidectomy (2016)

## 2022-04-03 ENCOUNTER — Other Ambulatory Visit: Payer: Self-pay

## 2022-04-03 ENCOUNTER — Ambulatory Visit
Admission: RE | Admit: 2022-04-03 | Discharge: 2022-04-03 | Disposition: A | Discharge status: Home / Self Care | Payer: No Typology Code available for payment source | Source: Ambulatory Visit | Attending: Radiation Oncology | Admitting: Radiation Oncology

## 2022-04-03 VITALS — BP 113/72 | HR 74 | Temp 98.2°F | Resp 18 | Wt 137.6 lb

## 2022-04-03 VITALS — BP 113/72 | HR 74 | Temp 98.2°F | Wt 137.6 lb

## 2022-04-03 DIAGNOSIS — C73 Malignant neoplasm of thyroid gland: Secondary | ICD-10-CM

## 2022-04-03 DIAGNOSIS — C50412 Malignant neoplasm of upper-outer quadrant of left female breast: Secondary | ICD-10-CM

## 2022-04-03 DIAGNOSIS — C7951 Secondary malignant neoplasm of bone: Secondary | ICD-10-CM | POA: Insufficient documentation
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
## 2022-04-06 ENCOUNTER — Ambulatory Visit
Admission: RE | Admit: 2022-04-06 | Discharge: 2022-04-06 | Disposition: A | Discharge status: Home / Self Care | Payer: Self-pay | Source: Ambulatory Visit | Attending: Radiation Oncology | Admitting: Radiation Oncology

## 2022-04-06 ENCOUNTER — Other Ambulatory Visit: Payer: Self-pay | Admitting: Radiation Oncology

## 2022-04-06 DIAGNOSIS — C7951 Secondary malignant neoplasm of bone: Secondary | ICD-10-CM

## 2022-04-06 DIAGNOSIS — C73 Malignant neoplasm of thyroid gland: Secondary | ICD-10-CM

## 2022-04-12 ENCOUNTER — Ambulatory Visit: Payer: No Typology Code available for payment source | Admitting: Radiation Oncology

## 2022-04-17 ENCOUNTER — Other Ambulatory Visit (HOSPITAL_COMMUNITY): Payer: No Typology Code available for payment source

## 2024-03-06 ENCOUNTER — Encounter: Payer: Self-pay | Admitting: Obstetrics and Gynecology

## 2024-03-06 ENCOUNTER — Ambulatory Visit (INDEPENDENT_AMBULATORY_CARE_PROVIDER_SITE_OTHER): Payer: PRIVATE HEALTH INSURANCE | Admitting: Obstetrics and Gynecology

## 2024-03-06 VITALS — BP 122/78 | HR 86

## 2024-03-06 DIAGNOSIS — R350 Frequency of micturition: Secondary | ICD-10-CM | POA: Diagnosis not present

## 2024-03-06 DIAGNOSIS — Z79890 Hormone replacement therapy (postmenopausal): Secondary | ICD-10-CM

## 2024-03-06 DIAGNOSIS — N8110 Cystocele, unspecified: Secondary | ICD-10-CM | POA: Diagnosis not present

## 2024-03-06 DIAGNOSIS — N393 Stress incontinence (female) (male): Secondary | ICD-10-CM | POA: Diagnosis not present

## 2024-03-06 DIAGNOSIS — N816 Rectocele: Secondary | ICD-10-CM

## 2024-03-06 LAB — POCT URINALYSIS DIP (CLINITEK)
Bilirubin, UA: NEGATIVE
Glucose, UA: NEGATIVE mg/dL
Ketones, POC UA: NEGATIVE mg/dL
Leukocytes, UA: NEGATIVE
Nitrite, UA: NEGATIVE
POC PROTEIN,UA: NEGATIVE
Spec Grav, UA: 1.02 (ref 1.010–1.025)
Urobilinogen, UA: 0.2 U/dL
pH, UA: 7 (ref 5.0–8.0)
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
## 2024-04-16 ENCOUNTER — Ambulatory Visit: Admitting: Obstetrics and Gynecology
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
## 2024-06-16 ENCOUNTER — Encounter: Payer: Self-pay | Admitting: Obstetrics and Gynecology

## 2024-06-16 ENCOUNTER — Ambulatory Visit (INDEPENDENT_AMBULATORY_CARE_PROVIDER_SITE_OTHER): Payer: Self-pay | Admitting: Obstetrics and Gynecology

## 2024-06-16 VITALS — BP 110/62 | HR 71 | Ht 66.25 in | Wt 150.0 lb

## 2024-06-16 DIAGNOSIS — Z79890 Hormone replacement therapy (postmenopausal): Secondary | ICD-10-CM | POA: Diagnosis not present

## 2024-06-16 NOTE — Patient Instructions (Signed)
 Menopause and HRT (Hormone Replacement Therapy): What to Expect Menopause is the time in your life when your menstrual period stops, and your ovaries stop making certain hormones. These hormones are called estrogen and progesterone. Low levels of these hormones can affect your health and cause symptoms. Hormone replacement therapy (HRT) is a treatment that replaces the estrogen and progesterone in your body. Types of HRT  There are many types and doses of HRT. Talk with your health care provider about what form of HRT is best for you. HRT may include: Estrogen and progestin. This is more common if you have a uterus. Estrogen-only therapy. This is used if you don't have a uterus. You may be given the HRT as: Pills. Patches. Gels. Sprays. A vaginal cream. Vaginal rings. Vaginal inserts. How many hormones you need to take and how long you should take them depend on your health. You should: Start HRT at the lowest possible dose. Stop HRT as soon as you're told to. Work with your provider so you feel informed and comfortable with what happens. Tell a health care provider about: Any allergies you have. Whether you've had blood clots or are at risk for blood clots. Whether you or family members have had cancer, such as cancer of: The breasts. The ovaries. The uterus. Any surgeries you've had. Any medical problems you have. All medicines you take. These include vitamins, herbs, eye drops, and creams. Whether you're pregnant or may be pregnant. What are the benefits? HRT can help with the symptoms of menopause. The benefits depend on: What symptoms you have. How bad your symptoms are. Your overall health. Symptoms of menopause that HRT can help with include: Hot flashes and night sweats. Hot flashes are sudden feelings of heat that spread over your face and body. Your skin may turn red, like a blush. Night sweats are hot flashes that happen when you're sleeping or trying to  sleep. Bone loss. The body loses calcium faster after menopause. This can cause your bones to be weaker. They may be more likely to break. Vaginal dryness. The lining of the vagina can get thin and dry, which can cause: Pain during sex. Infection. Burning. Itching. Urinary tract infections. Not being able to control when you pee. Irritability, which means getting annoyed easily. Short-term memory problems. What are the risks? Risks depend on your health and medical history. They also depend on if you get estrogen and progestin or estrogen-only therapy. HRT may make you more likely to have: Spotting. This is when a small amount of blood leaks from your vagina when you don't expect it to. Endometrial cancer. This is cancer of the lining of the uterus. Breast cancer. Higher density of breast tissue. This can make it harder to find breast cancer on an X-ray. A stroke. Heart disease. Blood clots. Gallbladder or liver disease. You may be more at risk for problems if you have: Endometrial cancer. Liver disease. Heart disease. Breast cancer. A history of blood clots. A history of stroke. Follow these instructions at home: Take your medicines only as told. Do not smoke, vape, or use nicotine or tobacco. Go to your provider as often as told to get: Mammograms. Pelvic exams. Medical checkups. Contact a health care provider if: Your legs hurt or swell. You feel lumps or changes in your breasts or armpits. You feel pain, burning, or bleeding when you pee. You have bleeding from your vagina that's not normal. You feel dizzy or get headaches. You have pain in your belly.  Get help right away if: You have shortness of breath. You have chest pain. You have slurred speech. Any part of your arms or legs feels weak or numb. These symptoms may be an emergency. Call 911 right away. Do not wait to see if the symptoms will go away. Do not drive yourself to the hospital. This information is  not intended to replace advice given to you by your health care provider. Make sure you discuss any questions you have with your health care provider. Document Revised: 05/10/2023 Document Reviewed: 05/10/2023 Elsevier Patient Education  2024 [employer].

## 2024-06-22 ENCOUNTER — Other Ambulatory Visit: Payer: Self-pay | Admitting: Family Medicine

## 2024-06-22 DIAGNOSIS — G8929 Other chronic pain: Secondary | ICD-10-CM

## 2024-10-05 ENCOUNTER — Encounter: Payer: Self-pay | Admitting: *Deleted

## 2024-10-15 ENCOUNTER — Ambulatory Visit: Admitting: Obstetrics and Gynecology

## 2024-10-19 ENCOUNTER — Ambulatory Visit: Admitting: Obstetrics and Gynecology

## 2024-11-30 ENCOUNTER — Ambulatory Visit: Admitting: Obstetrics and Gynecology
